# Patient Record
Sex: Male | Born: 1940 | Race: White | Hispanic: No | Marital: Married | Smoking: Former smoker
Health system: Southern US, Community
[De-identification: ages and names within clinical notes are randomized; demographics above are authoritative.]

## PROBLEM LIST (undated history)

## (undated) DIAGNOSIS — R569 Unspecified convulsions: Secondary | ICD-10-CM

## (undated) DIAGNOSIS — E785 Hyperlipidemia, unspecified: Secondary | ICD-10-CM

## (undated) DIAGNOSIS — E079 Disorder of thyroid, unspecified: Secondary | ICD-10-CM

## (undated) HISTORY — DX: Hyperlipidemia, unspecified: E78.5

## (undated) HISTORY — DX: Unspecified convulsions: R56.9

## (undated) HISTORY — DX: Disorder of thyroid, unspecified: E07.9

---

## 2001-12-16 ENCOUNTER — Encounter: Admission: RE | Admit: 2001-12-16 | Discharge: 2001-12-16 | Payer: Self-pay | Admitting: Internal Medicine

## 2001-12-16 ENCOUNTER — Encounter: Payer: Self-pay | Admitting: Internal Medicine

## 2004-02-21 ENCOUNTER — Encounter: Payer: Self-pay | Admitting: Internal Medicine

## 2005-01-28 ENCOUNTER — Encounter: Admission: RE | Admit: 2005-01-28 | Discharge: 2005-01-28 | Payer: Self-pay | Admitting: Internal Medicine

## 2005-02-18 ENCOUNTER — Encounter: Admission: RE | Admit: 2005-02-18 | Discharge: 2005-02-18 | Payer: Self-pay | Admitting: Internal Medicine

## 2007-01-27 ENCOUNTER — Encounter: Admission: RE | Admit: 2007-01-27 | Discharge: 2007-01-27 | Payer: Self-pay | Admitting: Internal Medicine

## 2009-03-01 ENCOUNTER — Encounter (INDEPENDENT_AMBULATORY_CARE_PROVIDER_SITE_OTHER): Payer: Self-pay | Admitting: *Deleted

## 2009-07-18 ENCOUNTER — Encounter (INDEPENDENT_AMBULATORY_CARE_PROVIDER_SITE_OTHER): Payer: Self-pay | Admitting: *Deleted

## 2009-08-20 ENCOUNTER — Encounter (INDEPENDENT_AMBULATORY_CARE_PROVIDER_SITE_OTHER): Payer: Self-pay

## 2009-08-21 ENCOUNTER — Ambulatory Visit: Payer: Self-pay | Admitting: Internal Medicine

## 2009-08-29 ENCOUNTER — Telehealth (INDEPENDENT_AMBULATORY_CARE_PROVIDER_SITE_OTHER): Payer: Self-pay | Admitting: *Deleted

## 2009-09-04 ENCOUNTER — Ambulatory Visit: Payer: Self-pay | Admitting: Internal Medicine

## 2009-09-04 HISTORY — PX: COLONOSCOPY: SHX174

## 2010-07-09 NOTE — Procedures (Signed)
Summary: Prep/[facility]  [facility]/ [facility]   Imported By: Lester [place] 08/16/2009 12:10:32  _____________________________________________________________________  External Attachment:    Type:   Image     Comment:   External Document

## 2010-07-09 NOTE — Letter (Signed)
Summary: Previsit letter  [facility]  [address] [place], [place] [address]   Phone: [phone]  Fax: [phone]       07/18/2009 MRN: [id number]  [facility] [address] [place], [place]  [address]  [place]  Dear Mr. GOINES,  Welcome to the Gastroenterology Division at [facility].    You are scheduled to see a nurse for your pre-procedure visit on 08-21-09 at 9:00a.m. on the 3rd floor at [facility], [address]  We ask that you try to arrive at our office 15 minutes prior to your appointment time to allow for check-in.  Your nurse visit will consist of discussing your medical and surgical history, your immediate family medical history, and your medications.    Please bring a complete list of all your medications or, if you prefer, bring the medication bottles and we will list them.  We will need to be aware of both prescribed and over the counter drugs.  We will need to know exact dosage information as well.  If you are on blood thinners (Coumadin, Plavix, Aggrenox, Ticlid, etc.) please call our office today/prior to your appointment, as we need to consult with your physician about holding your medication.   Please be prepared to read and sign documents such as consent forms, a financial agreement, and acknowledgement forms.  If necessary, and with your consent, a friend or relative is welcome to sit-in on the nurse visit with you.  Please bring your insurance card so that we may make a copy of it.  If your insurance requires a referral to see a specialist, please bring your referral form from your primary care physician.  No co-pay is required for this nurse visit.     If you cannot keep your appointment, please call [phone] to cancel or reschedule prior to your appointment date.  This allows [place] the opportunity to schedule an appointment for another patient in need of care.    Thank you for choosing [facility] for your  medical needs.  We appreciate the opportunity to care for you.  Please visit [place] at our website  to learn more about our practice.                     Sincerely.                                                                                                                   The Gastroenterology Division

## 2010-07-09 NOTE — Letter (Signed)
Summary: [facility] Instructions  [facility]  [address] [place], [place] [address]   Phone: [phone]  Fax: [phone]       Donald Robbins    Aug 02, 1940    MRN: [id number]        Procedure Day /Date:  09/04/09   Tuesday     Arrival Time:   7:30am      Procedure Time:  8:30am     Location of Procedure:                    _ x_  [facility] (4th Floor)                        PREPARATION FOR COLONOSCOPY WITH MOVIPREP   Starting 5 days prior to your procedure _ 3/24/11_ do not eat nuts, seeds, popcorn, corn, beans, peas,  salads, or any raw vegetables.  Do not take any fiber supplements (e.g. Metamucil, Citrucel, and Benefiber).  THE DAY BEFORE YOUR PROCEDURE         DATE:   09/03/09  DAY:  Monday  1.  Drink clear liquids the entire day-NO SOLID FOOD  2.  Do not drink anything colored red or purple.  Avoid juices with pulp.  No orange juice.  3.  Drink at least 64 oz. (8 glasses) of fluid/clear liquids during the day to prevent dehydration and help the prep work efficiently.  CLEAR LIQUIDS INCLUDE: Water Jello Ice Popsicles Tea (sugar ok, no milk/cream) Powdered fruit flavored drinks Coffee (sugar ok, no milk/cream) Gatorade Juice: apple, white grape, white cranberry  Lemonade Clear bullion, consomm, broth Carbonated beverages (any kind) Strained chicken noodle soup Hard Candy                             4.  In the morning, mix first dose of MoviPrep solution:    Empty 1 Pouch A and 1 Pouch B into the disposable container    Add lukewarm drinking water to the top line of the container. Mix to dissolve    Refrigerate (mixed solution should be used within 24 hrs)  5.  Begin drinking the prep at 5:00 p.m. The MoviPrep container is divided by 4 marks.   Every 15 minutes drink the solution down to the next mark (approximately 8 oz) until the full liter is complete.   6.  Follow completed prep with 16 oz of clear liquid of your  choice (Nothing red or purple).  Continue to drink clear liquids until bedtime.  7.  Before going to bed, mix second dose of MoviPrep solution:    Empty 1 Pouch A and 1 Pouch B into the disposable container    Add lukewarm drinking water to the top line of the container. Mix to dissolve    Refrigerate  THE DAY OF YOUR PROCEDURE      DATE:  09/04/09 DAY:  Tuesday  Beginning at  3:30 a.m. (5 hours before procedure):         1. Every 15 minutes, drink the solution down to the next mark (approx 8 oz) until the full liter is complete.  2. Follow completed prep with 16 oz. of clear liquid of your choice.    3. You may drink clear liquids until 6:30am  (2 HOURS BEFORE PROCEDURE).   MEDICATION INSTRUCTIONS  Unless otherwise instructed, you should take regular prescription medications with a small  sip of water   as early as possible the morning of your procedure.         OTHER INSTRUCTIONS  You will need a responsible adult at least 70 years of age to accompany you and drive you home.   This person must remain in the waiting room during your procedure.  Wear loose fitting clothing that is easily removed.  Leave jewelry and other valuables at home.  However, you may wish to bring a book to read or  an iPod/MP3 player to listen to music as you wait for your procedure to start.  Remove all body piercing jewelry and leave at home.  Total time from sign-in until discharge is approximately 2-3 hours.  You should go home directly after your procedure and rest.  You can resume normal activities the  day after your procedure.  The day of your procedure you should not:   Drive   Make legal decisions   Operate machinery   Drink alcohol   Return to work  You will receive specific instructions about eating, activities and medications before you leave.    The above instructions have been reviewed and explained to me by   Ulis Rias RN  August 21, 2009 9:10 AM     I  fully understand and can verbalize these instructions _____________________________ Date _________

## 2010-07-09 NOTE — Procedures (Signed)
Summary: [facility]  [facility]   Imported By: Lester [place] 08/16/2009 12:11:55  _____________________________________________________________________  External Attachment:    Type:   Image     Comment:   External Document

## 2010-07-09 NOTE — Progress Notes (Signed)
Summary: prep ?  Phone Note Call from Patient Call back at Home Phone [phone]   Caller: Patient Call For: Dr. Marina Goodell Reason for Call: Talk to Nurse Summary of Call: pt having a COL on the 29th, ut has been recently placed on Prednisone and wants to know if this is ok for him to take during his prep Initial call taken by: Vallarie Mare,  August 29, 2009 12:29 PM  Follow-up for Phone Call        talked with pt-- okay to continue Prednisone. Follow-up by: Ezra Sites RN,  August 29, 2009 12:49 PM

## 2010-07-09 NOTE — Miscellaneous (Signed)
Summary: Lec previsit  Clinical Lists Changes  Medications: Added new medication of MOVIPREP 100 GM  SOLR (PEG-KCL-NACL-NASULF-NA ASC-C) As per prep instructions. - Signed Rx of MOVIPREP 100 GM  SOLR (PEG-KCL-NACL-NASULF-NA ASC-C) As per prep instructions.;  #1 x 0;  Signed;  Entered by: Ulis Rias RN;  Authorized by: Hilarie Fredrickson MD;  Method used: Electronically to [facility]  [address] [id number] [address], [place], [place]  [address], Ph: [phone] or [phone], Fax: [phone] Observations: Added new observation of NKA: T (08/21/2009 8:36)    Prescriptions: MOVIPREP 100 GM  SOLR (PEG-KCL-NACL-NASULF-NA ASC-C) As per prep instructions.  #1 x 0   Entered by:   Ulis Rias RN   Authorized by:   Hilarie Fredrickson MD   Signed by:   Ulis Rias RN on 08/21/2009   Method used:   [facility] to        [facility]  [address] [id number]       [address]       [place], [place]  [address]       Ph: [phone] or [phone]       Fax: [phone]   RxID:   [id number]

## 2010-07-09 NOTE — Procedures (Signed)
Summary: Colonoscopy  Patient: [name] Note: All result statuses are Final unless otherwise noted.  Tests: (1) Colonoscopy (COL)   COL Colonoscopy           DONE     [facility]     [address]     [place], [place]  [address]           COLONOSCOPY PROCEDURE REPORT           PATIENT:  Donald Robbins, Donald Robbins  MR#:  [id number]     BIRTHDATE:  03-30-1941, 69 yrs. old  GENDER:  male     ENDOSCOPIST:  Wilhemina Bonito. Eda Keys, MD     REF. BY:  Surveillance Program Recall,     PROCEDURE DATE:  09/04/2009     PROCEDURE:  Surveillance Colonoscopy     ASA CLASS:  Class II     INDICATIONS:  history of polyps ; 2005 w/o path     MEDICATIONS:   Fentanyl 75 mcg IV, Versed 8 mg IV           DESCRIPTION OF PROCEDURE:   After the risks benefits and     alternatives of the procedure were thoroughly explained, informed     consent was obtained.  Digital rectal exam was performed and     revealed no abnormalities.   The LB CF-[id number] endoscope     was introduced through the anus and advanced to the cecum, which     was identified by both the appendix and ileocecal valve, without     limitations.Time to cecum = 3:39 min.  The quality of the prep was     good, using MoviPrep.  The instrument was then slowly withdrawn     (time =12:42 min) as the colon was fully examined.     <<PROCEDUREIMAGES>>           FINDINGS:  Severe diverticulosis was found throughout the colon.     No polyps or cancers were seen.   Retroflexed views in the rectum     revealed internal hemorrhoids.    The scope was then withdrawn     from the patient and the procedure completed.           COMPLICATIONS:  None     ENDOSCOPIC IMPRESSION:     1) Severe diverticulosis throughout the colon     2) No polyps or cancers     3) Internal hemorrhoids     RECOMMENDATIONS:     1) Continue current colorectal screening recommendations for     "routine risk" patients with a repeat colonoscopy in 10 years.        ______________________________     Wilhemina Bonito. Eda Keys, MD           CC:  Burton Apley, MD;The Patient           n.     Rosalie DoctorWilhemina Bonito. Eda Keys at 09/04/2009 09:37 AM           Rozetta Nunnery, [id number]  Note: An exclamation mark (!) indicates a result that was not dispersed into the flowsheet. Document Creation Date: 09/04/2009 9:38 AM _______________________________________________________________________  (1) Order result status: Final Collection or observation date-time: 09/04/2009 09:27 Requested date-time:  Receipt date-time:  Reported date-time:  Referring Physician:   Ordering Physician: Fransico Setters [phone]) Specimen Source:  Source: Launa Grill Order Number: [id number] Lab site:   Appended Document: Colonoscopy    Clinical Lists Changes  Observations: Added new observation of COLONNXTDUE: 08/2019 (09/04/2009 15:06)

## 2011-12-31 ENCOUNTER — Ambulatory Visit
Admission: RE | Admit: 2011-12-31 | Discharge: 2011-12-31 | Disposition: A | Discharge status: Home / Self Care | Payer: Medicare Other | Source: Ambulatory Visit | Attending: Internal Medicine | Admitting: Internal Medicine

## 2011-12-31 ENCOUNTER — Other Ambulatory Visit: Payer: Self-pay | Admitting: Internal Medicine

## 2011-12-31 DIAGNOSIS — R05 Cough: Secondary | ICD-10-CM

## 2016-04-08 ENCOUNTER — Ambulatory Visit
Admission: RE | Admit: 2016-04-08 | Discharge: 2016-04-08 | Disposition: A | Discharge status: Home / Self Care | Payer: Self-pay | Source: Ambulatory Visit | Attending: Internal Medicine | Admitting: Internal Medicine

## 2016-04-08 ENCOUNTER — Other Ambulatory Visit: Payer: Self-pay | Admitting: Internal Medicine

## 2016-04-08 DIAGNOSIS — R0781 Pleurodynia: Secondary | ICD-10-CM

## 2017-03-31 ENCOUNTER — Institutional Professional Consult (permissible substitution): Payer: Medicare Other | Admitting: Internal Medicine

## 2017-04-13 ENCOUNTER — Ambulatory Visit: Payer: Medicare Other | Admitting: Internal Medicine

## 2017-04-13 ENCOUNTER — Encounter: Payer: Self-pay | Admitting: Internal Medicine

## 2017-04-13 VITALS — BP 130/70 | HR 74 | Ht 75.0 in | Wt 166.2 lb

## 2017-04-13 DIAGNOSIS — Z87891 Personal history of nicotine dependence: Secondary | ICD-10-CM

## 2017-04-13 DIAGNOSIS — R911 Solitary pulmonary nodule: Secondary | ICD-10-CM | POA: Diagnosis not present

## 2017-04-13 DIAGNOSIS — R0609 Other forms of dyspnea: Secondary | ICD-10-CM

## 2017-04-13 NOTE — Patient Instructions (Signed)
ICD-10-CM   1. Nodule of lower lobe of left lung R91.1   2. Smoking history Z87.891   3. Dyspnea on exertion R06.09     Do PET scan Do full PFT  Return next few to several weeks to review PET scan and PFT with me or Dr  Lamonte Sakai or an APP

## 2017-04-13 NOTE — Progress Notes (Signed)
Subjective:    Patient ID: Donald Robbins, male    DOB: [birth year], 76 y.o.   MRN: [id number]    PCP [name], MD   HPI  IOV 04/13/2017  Chief Complaint  Patient presents with  . Advice Only    Referred by Dr. Mancel Bale due to a CT scan pt had done 03/12/17. Pt does have occ. SOB on exertion. Denies any cough or CP.  Pt states that he was exposed to a high amount of spray paint Summer 2018.    , Former smoker and a strong family history of lung cancer in his father and uncle. Patient himself is a limited smoker. He is quite active playing golf and doing a lot of other physical activities. He says that in spring summer 2018 he was preventing his car and then shortly after that he developed insidious onset of shortness of breath with exertion noticeable playing golf in a hurry and relieved by rest. No associated chest pain or cough or wheezing or hemoptysis. There is a 7 pound unintentional weight loss since that time. On 03/12/2017 he did have a CT scan of the chest low-dose CT scan for lung cancer screening and that showed Numerous pulmonary nodules with a nodule of greatest concern within the left lower lobe measuring 11.5 x 10.3 mm -> CT chest 03/12/17 done at [facility]. Lung-RADSTM CATEGORY:  Category 4A, suspicious. I do not have the image for my visualization but I was able to review the outside chart and confirmed the result. He is here for evaluation of the same. He is very concerned that he might have lung cancer because of his family history. He denies any travel to the [place] or [place]. He is also concerned about her shortness of breath   has no past medical history on file.   reports that he quit smoking about 46 years ago. His smoking use included cigarettes. He has a 5.00 pack-year smoking history. he has never used smokeless tobacco.  No past surgical history on file.  Not on File  Immunization History  Administered Date(s) Administered  . Influenza, High Dose  Seasonal PF 03/03/2017    No family history on file.   Current Outpatient Medications:  .  levothyroxine (SYNTHROID, LEVOTHROID) 50 MCG tablet, , Disp: , Rfl:  .  phenytoin (DILANTIN) 100 MG ER capsule, , Disp: , Rfl:  .  simvastatin (ZOCOR) 20 MG tablet, , Disp: , Rfl:     Review of Systems  Constitutional: Positive for unexpected weight change. Negative for fever.  HENT: Positive for postnasal drip. Negative for congestion, dental problem, ear pain, nosebleeds, rhinorrhea, sinus pressure, sneezing, sore throat and trouble swallowing.   Eyes: Negative for redness and itching.  Respiratory: Positive for shortness of breath. Negative for cough, chest tightness and wheezing.   Cardiovascular: Negative for palpitations and leg swelling.  Gastrointestinal: Negative for nausea and vomiting.  Genitourinary: Negative for dysuria.  Musculoskeletal: Negative for joint swelling.  Skin: Negative for rash.  Allergic/Immunologic: Negative.  Negative for environmental allergies, food allergies and immunocompromised state.  Neurological: Negative for headaches.  Hematological: Does not bruise/bleed easily.  Psychiatric/Behavioral: Negative for dysphoric mood. The patient is not nervous/anxious.        Objective:   Physical Exam  Constitutional: He is oriented to person, place, and time. He appears well-developed and well-nourished. No distress.  HENT:  Head: Normocephalic and atraumatic.  Right Ear: External ear normal.  Left Ear: External ear normal.  Mouth/Throat: Oropharynx is  clear and moist. No oropharyngeal exudate.  Eyes: Conjunctivae and EOM are normal. Pupils are equal, round, and reactive to light. Right eye exhibits no discharge. Left eye exhibits no discharge. No scleral icterus.  Neck: Normal range of motion. Neck supple. No JVD present. No tracheal deviation present. No thyromegaly present.  Cardiovascular: Normal rate, regular rhythm and intact distal pulses. Exam reveals no  gallop and no friction rub.  No murmur heard. Pulmonary/Chest: Effort normal and breath sounds normal. No respiratory distress. He has no wheezes. He has no rales. He exhibits no tenderness.  Abdominal: Soft. Bowel sounds are normal. He exhibits no distension and no mass. There is no tenderness. There is no rebound and no guarding.  Musculoskeletal: Normal range of motion. He exhibits no edema or tenderness.  Lymphadenopathy:    He has no cervical adenopathy.  Neurological: He is alert and oriented to person, place, and time. He has normal reflexes. No cranial nerve deficit. Coordination normal.  Skin: Skin is warm and dry. No rash noted. He is not diaphoretic. No erythema. No pallor.  Psychiatric: He has a normal mood and affect. His behavior is normal. Judgment and thought content normal.  Nursing note and vitals reviewed.   Vitals:   04/13/17 0904  BP: 130/70  Pulse: 74  SpO2: 98%  Weight: 166 lb 3.2 oz (75.4 kg)  Height: 6\' 3"  (1.905 m)    Estimated body mass index is 20.77 kg/m as calculated from the following:   Height as of this encounter: 6\' 3"  (1.905 m).   Weight as of this encounter: 166 lb 3.2 oz (75.4 kg).       Assessment & Plan:     ICD-10-CM   1. Nodule of lower lobe of left lung R91.1 Pulmonary function test  2. Smoking history Z87.891   3. Dyspnea on exertion R06.09   4. Solitary pulmonary nodule R91.1 NM PET Image Initial (PI) Skull Base To Thigh    Left lower lobe and other multiple lung nodules: Given male gender, age 8 and limited smoking history and strong family history I will put his overall probability for 1.1 cm left lower lobe lung nodule to be low intermediate probability for lung cancer. No spiculation or calcification described. He is not interested in exact sciences blood test study for evaluating pretest probability of a lung nodule. We discussed PET scan is a problem with any decision maker and he is very interested in it and does not want to  wait but instead wants to have the PET scan as soon as possible.  Shortness of breath on exertion: This remains unexplained we will start with getting a pulmonary function test  We will see him back in a few weeks. He is agreeable to plan   Dr. Brand Males, M.D., [facility].C.P Pulmonary and Critical Care Medicine Staff Physician [facility] Pager: [phone], If no answer or between  15:00h - 7:00h: call [phone]  04/13/2017 9:37 AM

## 2017-04-22 ENCOUNTER — Ambulatory Visit (HOSPITAL_COMMUNITY)
Admission: RE | Admit: 2017-04-22 | Discharge: 2017-04-22 | Disposition: A | Discharge status: Home / Self Care | Payer: Medicare Other | Source: Ambulatory Visit | Attending: Internal Medicine | Admitting: Internal Medicine

## 2017-04-22 DIAGNOSIS — K5730 Diverticulosis of large intestine without perforation or abscess without bleeding: Secondary | ICD-10-CM | POA: Insufficient documentation

## 2017-04-22 DIAGNOSIS — I700 Atherosclerosis of aorta: Secondary | ICD-10-CM | POA: Diagnosis not present

## 2017-04-22 DIAGNOSIS — N200 Calculus of kidney: Secondary | ICD-10-CM | POA: Insufficient documentation

## 2017-04-22 DIAGNOSIS — I2510 Atherosclerotic heart disease of native coronary artery without angina pectoris: Secondary | ICD-10-CM | POA: Insufficient documentation

## 2017-04-22 DIAGNOSIS — R911 Solitary pulmonary nodule: Secondary | ICD-10-CM | POA: Diagnosis present

## 2017-04-22 DIAGNOSIS — R918 Other nonspecific abnormal finding of lung field: Secondary | ICD-10-CM | POA: Diagnosis not present

## 2017-04-22 LAB — GLUCOSE, CAPILLARY: Glucose-Capillary: 93 mg/dL (ref 65–99)

## 2017-04-22 MED ORDER — FLUDEOXYGLUCOSE F - 18 (FDG) INJECTION
9.0000 | Freq: Once | INTRAVENOUS | Status: AC | PRN
  Administered 2017-04-22: 9 via INTRAVENOUS

## 2017-04-23 ENCOUNTER — Telehealth: Payer: Self-pay | Admitting: Internal Medicine

## 2017-04-23 DIAGNOSIS — I2584 Coronary atherosclerosis due to calcified coronary lesion: Principal | ICD-10-CM

## 2017-04-23 DIAGNOSIS — I2510 Atherosclerotic heart disease of native coronary artery without angina pectoris: Secondary | ICD-10-CM

## 2017-04-23 NOTE — Telephone Encounter (Signed)
Called pt to relay this information to him about the PET.  Will cancel pt's OV with RB.  OV scheduled 05/11/17 with MR.  Pt does have a PFT that was scheduled on 12/14 when he was to come see RB.  MR, please advise if pt still needs to have a PFT done or if we can cancel this.  Thanks!

## 2017-04-23 NOTE — Telephone Encounter (Signed)
Donald Robbins: Let patient know that PET scan nodule is "cold" and therefore lowering possibility this is lung cancer. So, in general our recommendation would be to watch.   Plan - he can cancel OV with RB and come now first aavailt to discuss with me instead Or - do a CT scan chest wo contrast in 6 months for fu of nodule and return to see me at that time  Also, Incidental finding  - kidney stone - he should talk to [name], MD  -  does have coronary artery calcification and if no normal cardiac stress test past few years; please refer to cardiologist - [facility] or Dr Einar Gip, first available     Nm Pet Image Initial (pi) Skull Base To Thigh  Result Date: 04/22/2017 CLINICAL DATA:  Initial treatment strategy for left-sided solitary pulmonary nodule seen on outside CT scan. EXAM: NUCLEAR MEDICINE PET SKULL BASE TO THIGH TECHNIQUE: 9.0 mCi F-18 FDG was injected intravenously. Full-ring PET imaging was performed from the skull base to thigh after the radiotracer. CT data was obtained and used for attenuation correction and anatomic localization. FASTING BLOOD GLUCOSE:  Value: 93 mg/dl COMPARISON:  None. FINDINGS: NECK No hypermetabolic lymph nodes in the neck. Mild bilateral carotid atherosclerotic calcification. CHEST 1.0 by 0.9 cm left lower lobe pulmonary nodule on image 54/8, maximum SUV in this vicinity 0.9. Several small bilateral subpleural nodules in the 3-4 mm range are not hypermetabolic on today' s PET-CT but are below sensitive PET-CT size thresholds. Coronary, aortic arch, and branch vessel atherosclerotic vascular disease. ABDOMEN/PELVIS No abnormal hypermetabolic activity within the liver, pancreas, adrenal glands, or spleen. No hypermetabolic lymph nodes in the abdomen or pelvis. Aortoiliac atherosclerotic vascular disease. Right mid kidney nonobstructive renal calculus measures 1.5 cm in long axis. Descending and sigmoid colon diverticulosis. SKELETON No focal hypermetabolic activity to  suggest skeletal metastasis. IMPRESSION: 1. The 1.0 by 0.9 cm left lower lobe pulmonary nodule demonstrates only very low-level metabolic activity favoring a benign etiology. Consider follow up CT scan of the chest in 1 years time to assess for stability and also assess the several other small subpleural nodules in the 3-5 mm size range. 2. Other imaging findings of potential clinical significance: Aortic Atherosclerosis (ICD10-I70.0). Coronary atherosclerosis. Large right mid kidney nonobstructive renal calculus. Descending and sigmoid colon diverticulosis. Electronically Signed   By: Van Clines M.D.   On: 04/22/2017 16:04

## 2017-04-23 NOTE — Telephone Encounter (Signed)
Called pt back to let him know about kidney stone that was noted on the PET scan and also about the coronary artery calcification that was noted as well.  Placed an order for pt to have a referral to Dr. Einar Gip with cardiology. Nothing further needed.

## 2017-04-23 NOTE — Telephone Encounter (Signed)
Patient states he just spoke with Raquel Sarna but has a few questions and would like a nurse to call back.  CB is [phone]

## 2017-04-24 NOTE — Telephone Encounter (Signed)
Called and spoke to patient he is aware of both appointment dates and times.

## 2017-04-24 NOTE — Telephone Encounter (Signed)
Called pt stating that I was aware of him speaking to Corrine about keeping the PFT appt but wanted to reschedule the PFT so we would have the results to go over with him on the day of his appt with MR.  PFT rescheduled for 05/08/17 at 12:00 and pt will see MR 05/11/17 at 9:45.  Nothing further needed.

## 2017-04-24 NOTE — Telephone Encounter (Signed)
Ok for him to have PFT and see me in December   Dr. Brand Males, M.D., [facility].C.P Pulmonary and Critical Care Medicine Staff Physician [place] [facility] Pager: [phone], If no answer or between  15:00h - 7:00h: call [phone]  04/24/2017 8:07 AM

## 2017-05-08 ENCOUNTER — Ambulatory Visit (INDEPENDENT_AMBULATORY_CARE_PROVIDER_SITE_OTHER): Payer: Medicare Other | Admitting: Internal Medicine

## 2017-05-08 DIAGNOSIS — R911 Solitary pulmonary nodule: Secondary | ICD-10-CM | POA: Diagnosis not present

## 2017-05-08 NOTE — Progress Notes (Signed)
PFT done today. 

## 2017-05-11 ENCOUNTER — Ambulatory Visit: Payer: Medicare Other | Admitting: Internal Medicine

## 2017-05-11 ENCOUNTER — Encounter: Payer: Self-pay | Admitting: Internal Medicine

## 2017-05-11 VITALS — BP 128/64 | HR 71 | Ht 75.0 in | Wt 170.2 lb

## 2017-05-11 DIAGNOSIS — R0609 Other forms of dyspnea: Secondary | ICD-10-CM

## 2017-05-11 DIAGNOSIS — R911 Solitary pulmonary nodule: Secondary | ICD-10-CM

## 2017-05-11 DIAGNOSIS — I2584 Coronary atherosclerosis due to calcified coronary lesion: Secondary | ICD-10-CM

## 2017-05-11 DIAGNOSIS — I2510 Atherosclerotic heart disease of native coronary artery without angina pectoris: Secondary | ICD-10-CM

## 2017-05-11 MED ORDER — ACLIDINIUM BROMIDE 400 MCG/ACT IN AEPB: 1.0000 | INHALATION_SPRAY | Freq: Two times a day (BID) | RESPIRATORY_TRACT | 0 refills | Status: DC

## 2017-05-11 NOTE — Patient Instructions (Signed)
ICD-10-CM   1. Nodule of lower lobe of left lung R91.1   2. Coronary artery calcification I25.10    I25.84   3. Dyspnea on exertion R06.09    Nodule of lower lobe of left lung - pet scan low uptake nov 2018; lowers possibility of lung cancer  - do followup CT scan chest wo contrast in 6 months; super D Protocol  Coronary artery calcification - keep appt with Dr Einar Gip  Dyspnea on exertion - see if cardiac workup turns up any explanation for this  - meanwhile try empiric incruse or tudorza or spiriva daily and give [place] feedback in few weeks  Followup 6 months but after ct chest wo contrast

## 2017-05-11 NOTE — Progress Notes (Signed)
Subjective:     Patient ID: Donald Robbins, male   DOB: [birth year], 76 y.o.   MRN: [id number]  HPI    PCP [name], MD   HPI  IOV 04/13/2017  Chief Complaint  Patient presents with  . Advice Only    Referred by Dr. Mancel Bale due to a CT scan pt had done 03/12/17. Pt does have occ. SOB on exertion. Denies any cough or CP.  Pt states that he was exposed to a high amount of spray paint Summer 2018.    , Former smoker and a strong family history of lung cancer in his father and uncle. Patient himself is a limited smoker. He is quite active playing golf and doing a lot of other physical activities. He says that in spring summer 2018 he was preventing his car and then shortly after that he developed insidious onset of shortness of breath with exertion noticeable playing golf in a hurry and relieved by rest. No associated chest pain or cough or wheezing or hemoptysis. There is a 7 pound unintentional weight loss since that time. On 03/12/2017 he did have a CT scan of the chest low-dose CT scan for lung cancer screening and that showed Numerous pulmonary nodules with a nodule of greatest concern within the left lower lobe measuring 11.5 x 10.3 mm -> CT chest 03/12/17 done at [facility]. Lung-RADSTM CATEGORY:  Category 4A, suspicious. I do not have the image for my visualization but I was able to review the outside chart and confirmed the result. He is here for evaluation of the same. He is very concerned that he might have lung cancer because of his family history. He denies any travel to the [place] or [place]. He is also concerned about her shortness of breath   has no past medical history on file.   reports that he quit smoking about 46 years ago. His smoking use included cigarettes. He has a 5.00 pack-year smoking history. he has never used smokeless tobacco.    OV 05/11/2017  Chief Complaint  Patient presents with  . Follow-up    PFT done 05/08/17 and PET scan done 04/22/17. Pt  states that he has been doing good since last visit. Only complaint is sinus drainage but has no complaints of cough, SOB, or CP.     FU nodule in ex-smoker: PET scan shows low uptake . They are recommending 1 year followup but I think he needs it sooner  Dyspnea: PFT normal but for mild reduction in dlco just below normal  Incidental findings: coronary artery calcification and renal stone and diverticulosis. Has cards appt pending. Other findings shared      IMPRESSION: 1. The 1.0 by 0.9 cm left lower lobe pulmonary nodule demonstrates only very low-level metabolic activity favoring a benign etiology. Consider follow up CT scan of the chest in 1 years time to assess for stability and also assess the several other small subpleural nodules in the 3-5 mm size range. 2. Other imaging findings of potential clinical significance: Aortic Atherosclerosis (ICD10-I70.0). Coronary atherosclerosis.  3.  Large right mid kidney nonobstructive renal calculus. 4.  Descending and sigmoid colon diverticulosis.   Electronically Signed   By: Van Clines M.D.   On: 04/22/2017 16:04    has no past medical history on file.   reports that he quit smoking about 46 years ago. His smoking use included cigarettes. He has a 5.00 pack-year smoking history. he has never used smokeless tobacco.  No past surgical history  on file.  Not on File  Immunization History  Administered Date(s) Administered  . Influenza, High Dose Seasonal PF 03/03/2017    No family history on file.   Current Outpatient Medications:  .  levothyroxine (SYNTHROID, LEVOTHROID) 50 MCG tablet, , Disp: , Rfl:  .  phenytoin (DILANTIN) 100 MG ER capsule, , Disp: , Rfl:  .  simvastatin (ZOCOR) 20 MG tablet, , Disp: , Rfl:   Review of Systems     Objective:   Physical Exam Vitals:   05/11/17 0936  BP: 128/64  Pulse: 71  SpO2: 99%  Weight: 170 lb 3.2 oz (77.2 kg)  Height: 6\' 3"  (1.905 m)    Estimated body  mass index is 21.27 kg/m as calculated from the following:   Height as of this encounter: 6\' 3"  (1.905 m).   Weight as of this encounter: 170 lb 3.2 oz (77.2 kg). Discussion visit    Assessment:       ICD-10-CM   1. Nodule of lower lobe of left lung R91.1   2. Coronary artery calcification I25.10    I25.84   3. Dyspnea on exertion R06.09        Plan:      Nodule of lower lobe of left lung - pet scan low uptake nov 2018; lowers possibility of lung cancer  - do followup CT scan chest wo contrast in 6 months; super D Protocol  Coronary artery calcification - keep appt with Dr Einar Gip  Dyspnea on exertion - see if cardiac workup turns up any explanation for this  - meanwhile try empiric incruse or tudorza or spiriva daily and give [place] feedback in few weeks  Followup 6 months but after ct chest wo contrast    (> 50% of this 15 min visit spent in face to face counseling or/and coordination of care)   Dr. Brand Males, M.D., [facility].C.P Pulmonary and Critical Care Medicine Staff Physician, [place] Director - Interstitial Lung Disease  Program  Pulmonary [place] at [place], [place], [address]  Pager: [phone], If no answer or between  15:00h - 7:00h: call [phone] Telephone: [phone]

## 2017-05-22 ENCOUNTER — Ambulatory Visit: Payer: Medicare Other | Admitting: Emergency Medicine

## 2017-05-27 ENCOUNTER — Emergency Department (HOSPITAL_COMMUNITY)
Admission: EM | Admit: 2017-05-27 | Discharge: 2017-05-27 | Disposition: A | Discharge status: Home / Self Care | Payer: Medicare Other | Attending: Emergency Medicine | Admitting: Emergency Medicine

## 2017-05-27 ENCOUNTER — Encounter (HOSPITAL_COMMUNITY): Payer: Self-pay

## 2017-05-27 ENCOUNTER — Emergency Department (HOSPITAL_COMMUNITY): Payer: Medicare Other

## 2017-05-27 DIAGNOSIS — Y9389 Activity, other specified: Secondary | ICD-10-CM | POA: Diagnosis not present

## 2017-05-27 DIAGNOSIS — Y998 Other external cause status: Secondary | ICD-10-CM | POA: Diagnosis not present

## 2017-05-27 DIAGNOSIS — W19XXXA Unspecified fall, initial encounter: Secondary | ICD-10-CM

## 2017-05-27 DIAGNOSIS — R569 Unspecified convulsions: Secondary | ICD-10-CM | POA: Insufficient documentation

## 2017-05-27 DIAGNOSIS — Z87891 Personal history of nicotine dependence: Secondary | ICD-10-CM | POA: Insufficient documentation

## 2017-05-27 DIAGNOSIS — W0110XA Fall on same level from slipping, tripping and stumbling with subsequent striking against unspecified object, initial encounter: Secondary | ICD-10-CM | POA: Insufficient documentation

## 2017-05-27 DIAGNOSIS — S0990XA Unspecified injury of head, initial encounter: Secondary | ICD-10-CM | POA: Diagnosis present

## 2017-05-27 DIAGNOSIS — Z79899 Other long term (current) drug therapy: Secondary | ICD-10-CM | POA: Diagnosis not present

## 2017-05-27 DIAGNOSIS — Y92017 Garden or yard in single-family (private) house as the place of occurrence of the external cause: Secondary | ICD-10-CM | POA: Diagnosis not present

## 2017-05-27 LAB — PHENYTOIN LEVEL, TOTAL: PHENYTOIN LVL: 3.1 ug/mL — AB (ref 10.0–20.0)

## 2017-05-27 MED ORDER — PHENYTOIN SODIUM 50 MG/ML IJ SOLN
1000.0000 mg | Freq: Once | INTRAMUSCULAR | Status: AC
  Administered 2017-05-27: 1000 mg via INTRAVENOUS
  Filled 2017-05-27: qty 20

## 2017-05-27 MED ORDER — PHENYTOIN SODIUM EXTENDED 100 MG PO CAPS: 100.0000 mg | ORAL_CAPSULE | Freq: Three times a day (TID) | ORAL | 0 refills | Status: DC

## 2017-05-27 NOTE — ED Notes (Signed)
Pt and family given ice water.

## 2017-05-27 NOTE — ED Notes (Signed)
ED Provider at bedside. 

## 2017-05-27 NOTE — ED Provider Notes (Signed)
I saw and evaluated the patient, reviewed the resident's note and I agree with the findings and plan.   EKG Interpretation None       Patient presents for evaluation of trip and fall, with head injury.  Patient was with a worker at home, when he fell.  His wife got to him about 2 minutes later and he was shaky as if he is having a seizure.  Shaking lasted a few seconds and was followed by a postictal state of 5-10 minutes.  No recent illnesses.  Dilantin dosing was lowered several months ago for unknown reason.  Alert elderly man who is comfortable.  Contusion and abrasion left scalp.  No dysarthria, aphasia or nystagmus.   Daleen Bo, MD 05/28/17 [phone]

## 2017-05-27 NOTE — ED Notes (Signed)
Family at bedside. Wife reports she did not in fact witness fall.

## 2017-05-27 NOTE — ED Notes (Signed)
Awaiting phenytoin from main pharmacy

## 2017-05-27 NOTE — Discharge Instructions (Signed)
Mr. Zagami, you were seen today after a fall which was likely due to his seizure as reported by your full body shaking and history of seizures.  We checked your Dilantin level in your blood which was low.  We gave you some IV Dilantin and are recommending that you increase your dose at home and have been provided a prescription for this.  You should have your Dilantin level checked in 1 week.  Please follow-up with your primary doctor at that time.  If you develop any more concerning signs or symptoms please come back to the emergency department.

## 2017-05-27 NOTE — ED Notes (Signed)
Per EDP, pt able to eat and drink

## 2017-05-27 NOTE — ED Notes (Signed)
Consulted with Jonni Sanger, pharmacist ensure filter on IV set was applied correctly.

## 2017-05-27 NOTE — ED Notes (Signed)
Patient transported to CT 

## 2017-05-27 NOTE — ED Provider Notes (Signed)
[place] EMERGENCY DEPARTMENT Provider Note   CSN: [id number] Arrival date & time: 05/27/17  1108     History   Chief Complaint Chief Complaint  Patient presents with  . Fall    HPI Donald Robbins is a 76 y.o. male.  Donald Robbins is a 76 year old male with history of seizure disorder presenting after a witnessed fall this morning with associated full body shaking and woke up with a superficial abrasion on the left side of his head.  Patient reportedly was outside talking to a contractor is doing some work on his house and the next thing he remembers is waking up in the ambulance.  Contractor reportedly saw the patient fall, however details have not been communicated with the patient or his wife.  He does not remember anything preceding the fall.  However does note that he went to sleep feeling normal and woke up this morning in his usual state.       History reviewed. No pertinent past medical history.  There are no active problems to display for this patient.   History reviewed. No pertinent surgical history.     Home Medications    Prior to Admission medications   Medication Sig Start Date End Date Taking? Authorizing Provider  Aclidinium Bromide (TUDORZA PRESSAIR) 400 MCG/ACT AEPB Inhale 1 puff into the lungs 2 (two) times daily. 05/11/17   Brand Males, MD  levothyroxine (SYNTHROID, LEVOTHROID) 50 MCG tablet  01/26/17   [provider]  phenytoin (DILANTIN) 100 MG ER capsule Take 1 capsule (100 mg total) by mouth 3 (three) times daily. 05/27/17 06/26/17  Eloise Levels, MD  simvastatin (ZOCOR) 20 MG tablet  01/26/17   [provider]    Family History No family history on file.  Social History Social History   Tobacco Use  . Smoking status: Former Smoker    Packs/day: 0.50    Years: 10.00    Pack years: 5.00    Types: Cigarettes    Last attempt to quit: 1972    Years since quitting: 46.9  . Smokeless  tobacco: Never Used  Substance Use Topics  . Alcohol use: Yes    Alcohol/week: 0.6 oz    Types: 1 Glasses of wine per week  . Drug use: No     Allergies   Patient has no known allergies.   Review of Systems Review of Systems  Constitutional: Negative.   HENT: Negative.   Eyes: Negative.   Respiratory: Negative.   Cardiovascular: Negative.   Gastrointestinal: Negative.   Musculoskeletal: Negative.   Neurological: Positive for seizures and syncope.     Physical Exam Updated Vital Signs BP 111/67   Pulse 70   Resp 14   Ht 6\' 3"  (1.905 m)   Wt 77.1 kg (170 lb)   SpO2 96%   BMI 21.25 kg/m   Physical Exam  Constitutional: He appears well-developed and well-nourished. No distress.  HENT:  Mild superficial abrasion on the lateral head in the parietal region with surrounding ecchymoses.   Skin: He is not diaphoretic.     ED Treatments / Results  Labs (all labs ordered are listed, but only abnormal results are displayed) Labs Reviewed  PHENYTOIN LEVEL, TOTAL - Abnormal; Notable for the following components:      Result Value   Phenytoin Lvl 3.1 (*)    All other components within normal limits    EKG  EKG Interpretation None       Radiology Ct  Head Wo Contrast  Result Date: 05/27/2017 CLINICAL DATA:  Status post fall. Head injury with loss of consciousness. EXAM: CT HEAD WITHOUT CONTRAST CT CERVICAL SPINE WITHOUT CONTRAST TECHNIQUE: Multidetector CT imaging of the head and cervical spine was performed following the standard protocol without intravenous contrast. Multiplanar CT image reconstructions of the cervical spine were also generated. COMPARISON:  None. FINDINGS: CT HEAD FINDINGS Brain: No evidence of acute infarction, hemorrhage, extra-axial collection, ventriculomegaly, or mass effect. Generalized cerebral atrophy. Periventricular white matter low attenuation likely secondary to microangiopathy. Vascular: Cerebrovascular atherosclerotic calcifications  are noted. Skull: Negative for fracture or focal lesion. Sinuses/Orbits: Visualized portions of the orbits are unremarkable. Visualized portions of the paranasal sinuses and mastoid air cells are unremarkable. Other: None. CT CERVICAL SPINE FINDINGS Alignment: Normal. Skull base and vertebrae: No acute fracture. No primary bone lesion or focal pathologic process. Soft tissues and spinal canal: No prevertebral fluid or swelling. No visible canal hematoma. Disc levels: Degenerative disc disease disc height loss at C4-5, C5-6 and C6-7 and to lesser extent C7-T1. Mild degenerative disc disease at C3-4. Mild broad-based disc osteophyte complex at C3-4 with bilateral uncovertebral degenerative changes. Moderate bilateral facet arthropathy at C4-5 and left uncovertebral degenerative changes resulting in moderate left foraminal stenosis. At C5-6 there is a broad-based disc osteophyte complex. At C6-7 there is bilateral facet arthropathy and bilateral uncovertebral degenerative changes with moderate left foraminal stenosis. Upper chest:  Lung apices are clear. Other: No fluid collection or hematoma. IMPRESSION: 1. No acute intracranial pathology. 2.  No acute osseous injury of the cervical spine. 3. Cervical spine spondylosis as described above. Electronically Signed   By: Kathreen Devoid   On: 05/27/2017 14:42   Ct Cervical Spine Wo Contrast  Result Date: 05/27/2017 CLINICAL DATA:  Status post fall. Head injury with loss of consciousness. EXAM: CT HEAD WITHOUT CONTRAST CT CERVICAL SPINE WITHOUT CONTRAST TECHNIQUE: Multidetector CT imaging of the head and cervical spine was performed following the standard protocol without intravenous contrast. Multiplanar CT image reconstructions of the cervical spine were also generated. COMPARISON:  None. FINDINGS: CT HEAD FINDINGS Brain: No evidence of acute infarction, hemorrhage, extra-axial collection, ventriculomegaly, or mass effect. Generalized cerebral atrophy. Periventricular  white matter low attenuation likely secondary to microangiopathy. Vascular: Cerebrovascular atherosclerotic calcifications are noted. Skull: Negative for fracture or focal lesion. Sinuses/Orbits: Visualized portions of the orbits are unremarkable. Visualized portions of the paranasal sinuses and mastoid air cells are unremarkable. Other: None. CT CERVICAL SPINE FINDINGS Alignment: Normal. Skull base and vertebrae: No acute fracture. No primary bone lesion or focal pathologic process. Soft tissues and spinal canal: No prevertebral fluid or swelling. No visible canal hematoma. Disc levels: Degenerative disc disease disc height loss at C4-5, C5-6 and C6-7 and to lesser extent C7-T1. Mild degenerative disc disease at C3-4. Mild broad-based disc osteophyte complex at C3-4 with bilateral uncovertebral degenerative changes. Moderate bilateral facet arthropathy at C4-5 and left uncovertebral degenerative changes resulting in moderate left foraminal stenosis. At C5-6 there is a broad-based disc osteophyte complex. At C6-7 there is bilateral facet arthropathy and bilateral uncovertebral degenerative changes with moderate left foraminal stenosis. Upper chest:  Lung apices are clear. Other: No fluid collection or hematoma. IMPRESSION: 1. No acute intracranial pathology. 2.  No acute osseous injury of the cervical spine. 3. Cervical spine spondylosis as described above. Electronically Signed   By: Kathreen Devoid   On: 05/27/2017 14:42    Procedures Procedures (including critical care time)  Medications Ordered in ED Medications  phenytoin (DILANTIN) 1,000 mg in sodium chloride 0.9 % 250 mL IVPB (0 mg Intravenous Stopped 05/27/17 1619)     Initial Impression / Assessment and Plan / ED Course  I have reviewed the triage vital signs and the nursing notes.  Pertinent labs & imaging results that were available during my care of the patient were reviewed by me and considered in my medical decision making (see chart for  details).     Patient is a 76 year old man with history of seizure disorder taking Dilantin 100 mg 2 times daily and found to have subtherapeutic Dilantin level and had a fall resulting in minor abrasion on the parietal region left side of his head.  He had normal CT head and neck and normal neurological exam.  Due to history of seizure, subtherapeutic Dilantin level and report of full body shaking and no evidence of prodrome or history of cardiac disease will treat as seizure.  Patient was given 1 g Dilantin load and discharged with recommendations to take 100 mg 3 times daily instant release.  Patient did take 100 mg this morning and was instructed to take an additional 200 mg this evening.  Recommended that he follow-up with his primary doctor next week and they can discuss whether he would be appropriate for him to follow-up with a neurologist in the future.  Discussed return precautions.   Final Clinical Impressions(s) / ED Diagnoses   Final diagnoses:  Seizure ([place])  Fall, initial encounter    ED Discharge Orders        Ordered    phenytoin (DILANTIN) 100 MG ER capsule  3 times daily     05/27/17 1620       Eloise Levels, MD 05/27/17 1623    Daleen Bo, MD 05/28/17 [phone]

## 2017-05-27 NOTE — ED Triage Notes (Addendum)
Pt from home via EMS for fall that resulting in a head injury and LOC. Per EMS, pt was walking outside when he tripped and fell on the concrete, hitting the L side of his head. Pt wife reports potential seizure like activity. Pt confused on EMS arrival but is now A&Ox4 however not able to recall incident. Hematoma noted to L side of head, no active bleeding. C-collar in place. EMS VS: 139/81, 90 HR, 97% on RA, CBG 130. 20 G LAC. No blood thinners.

## 2017-05-27 NOTE — ED Notes (Signed)
Pt wound cleaned with sterile saline and sterile gauze. Covered with bacitracin, 4x4 sterile gauze, and soft tape.

## 2017-06-23 ENCOUNTER — Other Ambulatory Visit: Payer: Self-pay | Admitting: Family Medicine

## 2017-11-04 ENCOUNTER — Ambulatory Visit (INDEPENDENT_AMBULATORY_CARE_PROVIDER_SITE_OTHER)
Admission: RE | Admit: 2017-11-04 | Discharge: 2017-11-04 | Disposition: A | Discharge status: Home / Self Care | Payer: Medicare Other | Source: Ambulatory Visit | Attending: Internal Medicine | Admitting: Internal Medicine

## 2017-11-04 DIAGNOSIS — R911 Solitary pulmonary nodule: Secondary | ICD-10-CM | POA: Diagnosis not present

## 2017-11-09 ENCOUNTER — Other Ambulatory Visit: Payer: Medicare Other

## 2017-11-10 ENCOUNTER — Encounter: Payer: Self-pay | Admitting: Internal Medicine

## 2017-11-10 ENCOUNTER — Ambulatory Visit: Payer: Medicare Other | Admitting: Internal Medicine

## 2017-11-10 VITALS — BP 116/70 | HR 60 | Ht 74.0 in | Wt 163.8 lb

## 2017-11-10 DIAGNOSIS — R911 Solitary pulmonary nodule: Secondary | ICD-10-CM | POA: Diagnosis not present

## 2017-11-10 DIAGNOSIS — R0609 Other forms of dyspnea: Secondary | ICD-10-CM | POA: Diagnosis not present

## 2017-11-10 NOTE — Progress Notes (Signed)
Subjective:     Patient ID: Donald Robbins, male   DOB: [birth year], 77 y.o.   MRN: [id number]  HPI    PCP [name], MD   HPI  IOV 04/13/2017  Chief Complaint  Patient presents with  . Advice Only    Referred by Dr. Mancel Bale due to a CT scan pt had done 03/12/17. Pt does have occ. SOB on exertion. Denies any cough or CP.  Pt states that he was exposed to a high amount of spray paint Summer 2018.    , Former smoker and a strong family history of lung cancer in his father and uncle. Patient himself is a limited smoker. He is quite active playing golf and doing a lot of other physical activities. He says that in spring summer 2018 he was preventing his car and then shortly after that he developed insidious onset of shortness of breath with exertion noticeable playing golf in a hurry and relieved by rest. No associated chest pain or cough or wheezing or hemoptysis. There is a 7 pound unintentional weight loss since that time. On 03/12/2017 he did have a CT scan of the chest low-dose CT scan for lung cancer screening and that showed Numerous pulmonary nodules with a nodule of greatest concern within the left lower lobe measuring 11.5 x 10.3 mm -> CT chest 03/12/17 done at [facility]. Lung-RADSTM CATEGORY:  Category 4A, suspicious. I do not have the image for my visualization but I was able to review the outside chart and confirmed the result. He is here for evaluation of the same. He is very concerned that he might have lung cancer because of his family history. He denies any travel to the [place] or [place]. He is also concerned about her shortness of breath   has no past medical history on file.   reports that he quit smoking about 46 years ago. His smoking use included cigarettes. He has a 5.00 pack-year smoking history. he has never used smokeless tobacco.    OV 05/11/2017  Chief Complaint  Patient presents with  . Follow-up    PFT done 05/08/17 and PET scan done 04/22/17. Pt  states that he has been doing good since last visit. Only complaint is sinus drainage but has no complaints of cough, SOB, or CP.     FU nodule in ex-smoker: PET scan shows low uptake . They are recommending 1 year followup but I think he needs it sooner  Dyspnea: PFT normal but for mild reduction in dlco just below normal  Incidental findings: coronary artery calcification and renal stone and diverticulosis. Has cards appt pending. Other findings shared    OV 11/10/2017  Chief Complaint  Patient presents with  . Follow-up    follow up for CT results. breathing good. patient is no longer using inhaler.    Follow-up lung nodules: He had a PET scan that showed low uptake.  But nevertheless the nodule was concerning so had a six-month CT scan done.  The CT scan was personally visualized.  It was read by Dr. Rosario Jacks and she feels its subpleural lymph nodes.  And therefore benign.  In terms of coronary artery calcification: He has seen Dr. Einar Gip and had cardiac stress test and was normal:.  In terms of dyspnea: This is improved.  We tried inhaler because of his isolated reduction diffusion capacity but the inhalers did not help.  Nevertheless overall he is improved.   CT chest 11/04/17 IMPRESSION: 1. Scattered subpleural nodules measure 10  mm or less in size, are unchanged from 04/22/2017 and are likely subpleural lymph nodes. 2. Aortic atherosclerosis (ICD10-170.0). Coronary artery calcification.   Electronically Signed   By: Lorin Picket M.D.   On: 11/04/2017 11:40    has no past medical history on file.   reports that he quit smoking about 47 years ago. His smoking use included cigarettes. He has a 5.00 pack-year smoking history. He has never used smokeless tobacco.  No past surgical history on file.  No Known Allergies  Immunization History  Administered Date(s) Administered  . Influenza, High Dose Seasonal PF 03/03/2017    No family history on file.   Current  Outpatient Medications:  .  aspirin EC 81 MG tablet, Take 81 mg by mouth daily., Disp: , Rfl:  .  levothyroxine (SYNTHROID, LEVOTHROID) 50 MCG tablet, Take 50 mcg by mouth daily before breakfast. , Disp: , Rfl:  .  Melatonin 5 MG TABS, Take 5 mg by mouth 2 (two) times daily. , Disp: , Rfl:  .  phenytoin (DILANTIN) 100 MG ER capsule, Take 100 mg by mouth 2 (two) times daily., Disp: , Rfl:  .  simvastatin (ZOCOR) 20 MG tablet, Take 20 mg by mouth daily. , Disp: , Rfl:  .  Aclidinium Bromide (TUDORZA PRESSAIR) 400 MCG/ACT AEPB, Inhale 1 puff into the lungs 2 (two) times daily. (Patient not taking: Reported on 11/10/2017), Disp: 1 each, Rfl: 0   Review of Systems     Objective:   Physical Exam  Constitutional: He is oriented to person, place, and time. He appears well-developed and well-nourished. No distress.  HENT:  Head: Normocephalic and atraumatic.  Right Ear: External ear normal.  Left Ear: External ear normal.  Mouth/Throat: Oropharynx is clear and moist. No oropharyngeal exudate.  Eyes: Pupils are equal, round, and reactive to light. Conjunctivae and EOM are normal. Right eye exhibits no discharge. Left eye exhibits no discharge. No scleral icterus.  Neck: Normal range of motion. Neck supple. No JVD present. No tracheal deviation present. No thyromegaly present.  Cardiovascular: Normal rate, regular rhythm and intact distal pulses. Exam reveals no gallop and no friction rub.  No murmur heard. Pulmonary/Chest: Effort normal and breath sounds normal. No respiratory distress. He has no wheezes. He has no rales. He exhibits no tenderness.  Abdominal: Soft. Bowel sounds are normal. He exhibits no distension and no mass. There is no tenderness. There is no rebound and no guarding.  Musculoskeletal: Normal range of motion. He exhibits no edema or tenderness.  Lymphadenopathy:    He has no cervical adenopathy.  Neurological: He is alert and oriented to person, place, and time. He has normal  reflexes. No cranial nerve deficit. Coordination normal.  Skin: Skin is warm and dry. No rash noted. He is not diaphoretic. No erythema. No pallor.  Psychiatric: He has a normal mood and affect. His behavior is normal. Judgment and thought content normal.  Nursing note and vitals reviewed.  Today's Vitals   11/10/17 1114  BP: 116/70  Pulse: 60  SpO2: 98%  Weight: 163 lb 12.8 oz (74.3 kg)  Height: 6\' 2"  (1.88 m)       Assessment:       ICD-10-CM   1. Nodule of lower lobe of left lung R91.1   2. Dyspnea on exertion R06.09        Plan:     Nodule of lower lobe of left lung  - stable; most c/w benign lymph node - repeat CT chest  without contrast June/july 2019; if stable then no furhter followup CT  Dyspnea on exertion - glad better and glad stress test for heart is fine  - expectant followup  Followup 1 year after CT chest without contrast   Dr. Brand Males, M.D., [facility].C.P Pulmonary and Critical Care Medicine Staff Physician, [place] Director - Interstitial Lung Disease  Program  Pulmonary [place] at [place], [place], [address]  Pager: [phone], If no answer or between  15:00h - 7:00h: call [phone] Telephone: [phone]

## 2017-11-10 NOTE — Patient Instructions (Addendum)
Nodule of lower lobe of left lung  - stable; most c/w benign lymph node - repeat CT chest without contrast June/july 2019; if stable then no furhter followup CT  Dyspnea on exertion - glad better and glad stress test for heart is fine  - expectant followup  Followup 1 year after CT chest without contrast

## 2017-12-08 LAB — PULMONARY FUNCTION TEST
DL/VA % pred: 79 %
DL/VA: 3.8 ml/min/mmHg/L
DLCO COR % PRED: 72 %
DLCO UNC: 27.01 ml/min/mmHg
DLCO cor: 26.79 ml/min/mmHg
DLCO unc % pred: 72 %
FEF 25-75 POST: 3.38 L/s
FEF 25-75 Pre: 3.3 L/sec
FEF2575-%Change-Post: 2 %
FEF2575-%PRED-POST: 136 %
FEF2575-%Pred-Pre: 133 %
FEV1-%CHANGE-POST: 1 %
FEV1-%Pred-Post: 116 %
FEV1-%Pred-Pre: 114 %
FEV1-PRE: 3.96 L
FEV1-Post: 4.02 L
FEV1FVC-%Change-Post: -1 %
FEV1FVC-%Pred-Pre: 105 %
FEV6-%Change-Post: 3 %
FEV6-%PRED-POST: 119 %
FEV6-%Pred-Pre: 115 %
FEV6-POST: 5.36 L
FEV6-PRE: 5.17 L
FEV6FVC-%CHANGE-POST: 0 %
FEV6FVC-%PRED-POST: 106 %
FEV6FVC-%PRED-PRE: 106 %
FVC-%Change-Post: 2 %
FVC-%PRED-POST: 112 %
FVC-%PRED-PRE: 109 %
FVC-POST: 5.38 L
FVC-PRE: 5.23 L
Post FEV1/FVC ratio: 75 %
Post FEV6/FVC ratio: 100 %
Pre FEV1/FVC ratio: 76 %
Pre FEV6/FVC Ratio: 100 %
RV % PRED: 84 %
RV: 2.36 L
TLC % PRED: 95 %
TLC: 7.41 L

## 2018-12-16 ENCOUNTER — Inpatient Hospital Stay: Admission: RE | Admit: 2018-12-16 | Payer: Medicare Other | Source: Ambulatory Visit

## 2018-12-21 ENCOUNTER — Telehealth: Payer: Self-pay | Admitting: *Deleted

## 2018-12-21 NOTE — Telephone Encounter (Signed)

## 2018-12-22 ENCOUNTER — Ambulatory Visit (INDEPENDENT_AMBULATORY_CARE_PROVIDER_SITE_OTHER)
Admission: RE | Admit: 2018-12-22 | Discharge: 2018-12-22 | Disposition: A | Discharge status: Home / Self Care | Payer: Medicare Other | Source: Ambulatory Visit | Attending: Internal Medicine | Admitting: Internal Medicine

## 2018-12-22 ENCOUNTER — Other Ambulatory Visit: Payer: Self-pay

## 2018-12-22 DIAGNOSIS — R911 Solitary pulmonary nodule: Secondary | ICD-10-CM

## 2019-05-12 ENCOUNTER — Other Ambulatory Visit: Payer: Self-pay

## 2019-05-12 DIAGNOSIS — Z20822 Contact with and (suspected) exposure to covid-19: Secondary | ICD-10-CM

## 2019-05-14 LAB — NOVEL CORONAVIRUS, NAA: SARS-CoV-2, NAA: DETECTED — AB

## 2019-05-15 ENCOUNTER — Telehealth: Payer: Self-pay | Admitting: Unknown Physician Specialty

## 2019-05-15 ENCOUNTER — Other Ambulatory Visit: Payer: Self-pay | Admitting: Unknown Physician Specialty

## 2019-05-15 DIAGNOSIS — U071 COVID-19: Secondary | ICD-10-CM

## 2019-05-15 NOTE — Telephone Encounter (Signed)
Discussed with patient about Covid symptoms and the use of bamlanivimab, a monoclonal antibody infusion for those with mild to moderate Covid symptoms and at a high risk of hospitalization.  Pt is qualified for this infusion at the [facility] infusion center due to Age > 70.    After discussing the infusion's costs, potential benefits and side effects, the patient has decided to accept treatment with monoclonal antibodies.

## 2019-05-16 ENCOUNTER — Ambulatory Visit (HOSPITAL_COMMUNITY): Payer: Medicare Other

## 2019-05-16 NOTE — Telephone Encounter (Signed)
Pt returning call/needs appointment information. Please call pt: [phone]

## 2019-05-16 NOTE — Telephone Encounter (Signed)
Scheduled for 0830 12/8

## 2019-05-17 ENCOUNTER — Ambulatory Visit (HOSPITAL_COMMUNITY)
Admission: RE | Admit: 2019-05-17 | Discharge: 2019-05-17 | Disposition: A | Discharge status: Home / Self Care | Payer: Medicare Other | Source: Ambulatory Visit | Attending: Pulmonary Disease | Admitting: Pulmonary Disease

## 2019-05-17 DIAGNOSIS — Z23 Encounter for immunization: Secondary | ICD-10-CM | POA: Diagnosis not present

## 2019-05-17 DIAGNOSIS — U071 COVID-19: Secondary | ICD-10-CM | POA: Insufficient documentation

## 2019-05-17 MED ORDER — ALBUTEROL SULFATE HFA 108 (90 BASE) MCG/ACT IN AERS: 2.0000 | INHALATION_SPRAY | Freq: Once | RESPIRATORY_TRACT | Status: DC | PRN

## 2019-05-17 MED ORDER — FAMOTIDINE IN NACL 20-0.9 MG/50ML-% IV SOLN: 20.0000 mg | Freq: Once | INTRAVENOUS | Status: DC | PRN

## 2019-05-17 MED ORDER — SODIUM CHLORIDE 0.9 % IV SOLN: INTRAVENOUS | Status: DC | PRN

## 2019-05-17 MED ORDER — METHYLPREDNISOLONE SODIUM SUCC 125 MG IJ SOLR: 125.0000 mg | Freq: Once | INTRAMUSCULAR | Status: DC | PRN

## 2019-05-17 MED ORDER — DIPHENHYDRAMINE HCL 50 MG/ML IJ SOLN: 50.0000 mg | Freq: Once | INTRAMUSCULAR | Status: DC | PRN

## 2019-05-17 MED ORDER — EPINEPHRINE 0.3 MG/0.3ML IJ SOAJ: 0.3000 mg | Freq: Once | INTRAMUSCULAR | Status: DC | PRN

## 2019-05-17 MED ORDER — SODIUM CHLORIDE 0.9 % IV SOLN
700.0000 mg | Freq: Once | INTRAVENOUS | Status: AC
  Administered 2019-05-17: 700 mg via INTRAVENOUS
  Filled 2019-05-17: qty 20

## 2019-05-17 NOTE — Progress Notes (Signed)
.   Diagnosis: COVID-19  Physician: Dr. Joya Gaskins   Procedure: [facility] Infusion [facility] Med: bamlanivimab infusion - Provided patient with bamlanimivab fact sheet for patients, parents and caregivers prior to infusion.  Complications: No immediate complications noted.  Discharge: Discharged home   [name] 05/17/2019

## 2019-07-28 ENCOUNTER — Telehealth: Payer: Self-pay | Admitting: Internal Medicine

## 2019-07-28 NOTE — Telephone Encounter (Signed)
Ct summer 2020 - stable lung noduled -> pls give result but apologize for delay ->then dec 2020 he got covid   Plan  - do CT chst without contrast in summer 2021 -> return to see APP (OR) he can come and see me/app in April 2021     IMPRESSION: 1. Small bilateral subpleural nodules are not changed from April 22, 2017. For the most part these are thought to be benign. The 9 by 9 mm left lower lobe subpleural nodule was not previously hypermetabolic on November 14, 99991111, and is likewise highly likely to be benign. Given the 20 months of demonstrated stability, consider one final follow up noncontrast chest CT in 6 months time to fully establish 2 full years of stability. 2.  Aortic Atherosclerosis (ICD10-I70.0).  Coronary atherosclerosis.   Electronically Signed   By: Van Clines M.D.   On: 12/22/2018 11:31

## 2019-08-09 NOTE — Telephone Encounter (Signed)
Called and spoke with pt to see if I could get him scheduled for an appt and he stated that was fine. Pt has been scheduled an appt with MR Mon. 4/26. Nothing further needed.

## 2019-10-03 ENCOUNTER — Encounter: Payer: Self-pay | Admitting: Internal Medicine

## 2019-10-03 ENCOUNTER — Ambulatory Visit (INDEPENDENT_AMBULATORY_CARE_PROVIDER_SITE_OTHER): Payer: Medicare Other | Admitting: Internal Medicine

## 2019-10-03 ENCOUNTER — Other Ambulatory Visit: Payer: Self-pay

## 2019-10-03 VITALS — BP 112/68 | HR 60 | Ht 74.0 in | Wt 168.0 lb

## 2019-10-03 DIAGNOSIS — G8929 Other chronic pain: Secondary | ICD-10-CM

## 2019-10-03 DIAGNOSIS — M25552 Pain in left hip: Secondary | ICD-10-CM | POA: Diagnosis not present

## 2019-10-03 DIAGNOSIS — R918 Other nonspecific abnormal finding of lung field: Secondary | ICD-10-CM

## 2019-10-03 DIAGNOSIS — R911 Solitary pulmonary nodule: Secondary | ICD-10-CM

## 2019-10-03 DIAGNOSIS — R0600 Dyspnea, unspecified: Secondary | ICD-10-CM

## 2019-10-03 DIAGNOSIS — R0609 Other forms of dyspnea: Secondary | ICD-10-CM

## 2019-10-03 NOTE — Patient Instructions (Addendum)
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

## 2019-10-03 NOTE — Progress Notes (Signed)
PCP [name], MD   HPI  IOV 04/13/2017  Chief Complaint  Patient presents with  . Advice Only    Referred by Dr. Mancel Bale due to a CT scan pt had done 03/12/17. Pt does have occ. SOB on exertion. Denies any cough or CP.  Pt states that he was exposed to a high amount of spray paint Summer 2018.    , Former smoker and a strong family history of lung cancer in his father and uncle. Patient himself is a limited smoker. He is quite active playing golf and doing a lot of other physical activities. He says that in spring summer 2018 he was preventing his car and then shortly after that he developed insidious onset of shortness of breath with exertion noticeable playing golf in a hurry and relieved by rest. No associated chest pain or cough or wheezing or hemoptysis. There is a 7 pound unintentional weight loss since that time. On 03/12/2017 he did have a CT scan of the chest low-dose CT scan for lung cancer screening and that showed Numerous pulmonary nodules with a nodule of greatest concern within the left lower lobe measuring 11.5 x 10.3 mm -> CT chest 03/12/17 done at [facility]. Lung-RADSTM CATEGORY:  Category 4A, suspicious. I do not have the image for my visualization but I was able to review the outside chart and confirmed the result. He is here for evaluation of the same. He is very concerned that he might have lung cancer because of his family history. He denies any travel to the [place] or [place]. He is also concerned about her shortness of breath   has no past medical history on file.   reports that he quit smoking about 46 years ago. His smoking use included cigarettes. He has a 5.00 pack-year smoking history. he has never used smokeless tobacco.    OV 05/11/2017  Chief Complaint  Patient presents with  . Follow-up    PFT done 05/08/17 and PET scan done 04/22/17. Pt states that he has been doing good since last visit. Only complaint is sinus drainage but has no complaints  of cough, SOB, or CP.     FU nodule in ex-smoker: PET scan shows low uptake . They are recommending 1 year followup but I think he needs it sooner  Dyspnea: PFT normal but for mild reduction in dlco just below normal  Incidental findings: coronary artery calcification and renal stone and diverticulosis. Has cards appt pending. Other findings shared    OV 11/10/2017  Chief Complaint  Patient presents with  . Follow-up    follow up for CT results. breathing good. patient is no longer using inhaler.    Follow-up lung nodules: He had a PET scan that showed low uptake.  But nevertheless the nodule was concerning so had a six-month CT scan done.  The CT scan was personally visualized.  It was read by Dr. Rosario Jacks and she feels its subpleural lymph nodes.  And therefore benign.  In terms of coronary artery calcification: He has seen Dr. Einar Gip and had cardiac stress test and was normal:.  In terms of dyspnea: This is improved.  We tried inhaler because of his isolated reduction diffusion capacity but the inhalers did not help.  Nevertheless overall he is improved.   CT chest 11/04/17 IMPRESSION: 1. Scattered subpleural nodules measure 10 mm or less in size, are unchanged from 04/22/2017 and are likely subpleural lymph nodes. 2. Aortic atherosclerosis (ICD10-170.0). Coronary artery calcification.  Electronically Signed   By: Lorin Picket M.D.   On: 11/04/2017 11:40    has no past medical history on file.   reports that he quit smoking about 47 years ago. His smoking use included cigarettes. He has a 5.00 pack-year smoking history. He has never used smokeless tobacco.   OV 10/03/2019  Subjective:  Patient ID: Donald Robbins, male , DOB: [birth year] , age 79 y.o. , MRN: [id number] , ADDRESS: [place] [address]   10/03/2019 -   Chief Complaint  Patient presents with  . Follow-up    Pt last seen 11/10/17.  Pt states he has been doing fine since last visit.  Pt states he has been having some pain in the back right above the hip but states breathing has been good.     HPI Donald Robbins 79 y.o. -returns for follow-up.  He does not have dyspnea.  He is feeling fine.  In December 2020 he had COVID-19 and got monoclonal antibody.  He did well did not need hospitalization he does not have residual effects.  He has pulmonary nodule.  He had a CT scan in July 2020 these are essentially unchanged from November 2018.  A repeat CT scan of the chest is recommended.  He is not having any hemoptysis or weight loss.  The only new issue is having is left hip/left flank suprailiac area pain that is constant and intermittent without clear-cut aggravating or relieving factors in September 2020.  He says initially primary care has reassured him but it is persisting and is not followed up.  There is no weight loss.  Is not limiting his activities but it is persistent.  It is mild to moderate in intensity there is no radiation.    IMPRESSION: CT cest July 2020 1. Small bilateral subpleural nodules are not changed from April 22, 2017. For the most part these are thought to be benign. The 9 by 9 mm left lower lobe subpleural nodule was not previously hypermetabolic on November 14, 99991111, and is likewise highly likely to be benign. Given the 20 months of demonstrated stability, consider one final follow up noncontrast chest CT in 6 months time to fully establish 2 full years of stability. 2.  Aortic Atherosclerosis (ICD10-I70.0).  Coronary atherosclerosis.   Electronically Signed   By: Van Clines M.D.   On: 12/22/2018 11:31 ROS - per HPI     has no past medical history on file.   reports that he quit smoking about 49 years ago. His smoking use included cigarettes. He has a 5.00 pack-year smoking history. He has never used smokeless tobacco.  No past surgical history on file.  No Known Allergies  Immunization History  Administered Date(s)  Administered  . Influenza, High Dose Seasonal PF 03/03/2017, 02/08/2019  . PFIZER SARS-COV-2 Vaccination 08/17/2019, 08/29/2019    No family history on file.   Current Outpatient Medications:  .  aspirin EC 81 MG tablet, Take 81 mg by mouth daily., Disp: , Rfl:  .  levothyroxine (SYNTHROID, LEVOTHROID) 50 MCG tablet, Take 50 mcg by mouth daily before breakfast. , Disp: , Rfl:  .  Melatonin 5 MG TABS, Take 5 mg by mouth 2 (two) times daily. , Disp: , Rfl:  .  phenytoin (DILANTIN) 100 MG ER capsule, Take 100 mg by mouth 2 (two) times daily., Disp: , Rfl:  .  simvastatin (ZOCOR) 20 MG tablet, Take 20 mg by mouth daily. , Disp: , Rfl:  Objective:   Vitals:   10/03/19 1109  BP: 112/68  Pulse: 60  SpO2: 100%  Weight: 168 lb (76.2 kg)  Height: 6\' 2"  (1.88 m)    Estimated body mass index is 21.57 kg/m as calculated from the following:   Height as of this encounter: 6\' 2"  (1.88 m).   Weight as of this encounter: 168 lb (76.2 kg).  @WEIGHTCHANGE @  [employer]   10/03/19 1109  Weight: 168 lb (76.2 kg)     Physical Exam Tall thin male.  Normal lung sounds normal heart sounds alert and oriented x3.  No tenderness elicited in the left flank area.  Spine appears normal.  Gait is normal.  Stands well abdomen soft.  No neck nodes no elevated JVP.  Throat is normal.      Assessment:       ICD-10-CM   1. Nodule of lower lobe of left lung  R91.1   2. Dyspnea on exertion  R06.00   3. 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 Brand Males, M.D., F.C.C.P,  Pulmonary and Critical Care Medicine Staff Physician, [facility] Director - Interstitial Lung Disease  Program  [facility] [place] [facility] [place], [place], [address]  Pager: [phone], If no answer or between  15:00h - 7:00h: call [phone] Telephone: [phone]  11:33 AM 10/03/2019

## 2019-11-04 ENCOUNTER — Ambulatory Visit (AMBULATORY_SURGERY_CENTER): Payer: Self-pay | Admitting: *Deleted

## 2019-11-04 ENCOUNTER — Other Ambulatory Visit: Payer: Self-pay

## 2019-11-04 VITALS — Ht 74.0 in | Wt 166.0 lb

## 2019-11-04 DIAGNOSIS — Z1211 Encounter for screening for malignant neoplasm of colon: Secondary | ICD-10-CM

## 2019-11-04 MED ORDER — NA SULFATE-K SULFATE-MG SULF 17.5-3.13-1.6 GM/177ML PO SOLN: ORAL | 0 refills | Status: DC

## 2019-11-04 NOTE — Progress Notes (Signed)
Patient is here in-person for PV. Patient denies any allergies to eggs or soy. Patient denies any problems with anesthesia/sedation. Patient denies any oxygen use at home. Patient denies taking any diet/weight loss medications or blood thinners. Patient is not being treated for MRSA or C-diff. Patient is aware of our care-partner policy and 0000000 safety protocol. EMMI education assisgned to the patient for the procedure, this was explained and instructions given to patient.   Completed covid vaccines on 08/29/19.

## 2019-11-09 ENCOUNTER — Encounter: Payer: Self-pay | Admitting: Internal Medicine

## 2019-11-15 ENCOUNTER — Ambulatory Visit (AMBULATORY_SURGERY_CENTER): Payer: Medicare Other | Admitting: Internal Medicine

## 2019-11-15 ENCOUNTER — Other Ambulatory Visit: Payer: Self-pay

## 2019-11-15 ENCOUNTER — Encounter: Payer: Self-pay | Admitting: Internal Medicine

## 2019-11-15 VITALS — BP 110/65 | HR 59 | Resp 10 | Ht 74.0 in | Wt 166.0 lb

## 2019-11-15 DIAGNOSIS — K5730 Diverticulosis of large intestine without perforation or abscess without bleeding: Secondary | ICD-10-CM

## 2019-11-15 DIAGNOSIS — K635 Polyp of colon: Secondary | ICD-10-CM

## 2019-11-15 DIAGNOSIS — D125 Benign neoplasm of sigmoid colon: Secondary | ICD-10-CM

## 2019-11-15 DIAGNOSIS — Z1211 Encounter for screening for malignant neoplasm of colon: Secondary | ICD-10-CM | POA: Diagnosis not present

## 2019-11-15 MED ORDER — SODIUM CHLORIDE 0.9 % IV SOLN: 500.0000 mL | Freq: Once | INTRAVENOUS | Status: DC

## 2019-11-15 NOTE — Op Note (Signed)
[facility] Patient Name: Donald Robbins Procedure Date: 11/15/2019 10:37 AM MRN: [id number] Endoscopist: Docia Chuck. [name] , MD Age: 79 Referring MD:  Date of Birth: 08/04/1940 Gender: Male Account #: [id number] Procedure:                Colonoscopy with cold snare polypectomy x 1 Indications:              Screening for colorectal malignant neoplasm.                            Previous examinations 2005 and 2011 were negative                            for neoplasia Medicines:                Monitored Anesthesia Care Procedure:                Pre-Anesthesia Assessment:                           - Prior to the procedure, a History and Physical                            was performed, and patient medications and                            allergies were reviewed. The patient's tolerance of                            previous anesthesia was also reviewed. The risks                            and benefits of the procedure and the sedation                            options and risks were discussed with the patient.                            All questions were answered, and informed consent                            was obtained. Prior Anticoagulants: The patient has                            taken no previous anticoagulant or antiplatelet                            agents. ASA Grade Assessment: II - A patient with                            mild systemic disease. After reviewing the risks                            and benefits, the patient was deemed in  satisfactory condition to undergo the procedure.                           After obtaining informed consent, the colonoscope                            was passed under direct vision. Throughout the                            procedure, the patient's blood pressure, pulse, and                            oxygen saturations were monitored continuously. The                            Colonoscope was  introduced through the anus and                            advanced to the the cecum, identified by                            appendiceal orifice and ileocecal valve. The                            ileocecal valve, appendiceal orifice, and rectum                            were photographed. The quality of the bowel                            preparation was good. The colonoscopy was performed                            without difficulty. The patient tolerated the                            procedure well. The bowel preparation used was                            SUPREP via split dose instruction. Scope In: 10:49:12 AM Scope Out: 11:04:44 AM Scope Withdrawal Time: 0 hours 12 minutes 32 seconds  Total Procedure Duration: 0 hours 15 minutes 32 seconds  Findings:                 A 2 mm polyp was found in the sigmoid colon. The                            polyp was removed with a cold snare. Resection and                            retrieval were complete.                           Many small and large-mouthed diverticula were found  in the entire colon.                           The exam was otherwise without abnormality on                            direct and retroflexion views. Complications:            No immediate complications. Estimated blood loss:                            None. Estimated Blood Loss:     Estimated blood loss: none. Impression:               - One 2 mm polyp in the sigmoid colon, removed with                            a cold snare. Resected and retrieved.                           - Diverticulosis in the entire examined colon.                           - The examination was otherwise normal on direct                            and retroflexion views. Recommendation:           - Repeat colonoscopy is not recommended for                            surveillance.                           - Patient has a contact number available for                             emergencies. The signs and symptoms of potential                            delayed complications were discussed with the                            patient. Return to normal activities tomorrow.                            Written discharge instructions were provided to the                            patient.                           - Resume previous diet.                           - Continue present medications.                           -  Await pathology results. Docia Chuck. [name], MD 11/15/2019 11:10:01 AM This report has been signed electronically.

## 2019-11-15 NOTE — Progress Notes (Signed)
Called to room to assist during endoscopic procedure.  Patient ID and intended procedure confirmed with present staff. Received instructions for my participation in the procedure from the performing physician.  

## 2019-11-15 NOTE — Patient Instructions (Signed)
YOU HAD AN ENDOSCOPIC PROCEDURE TODAY AT THE [facility]:   Refer to the procedure report that was given to you for any specific questions about what was found during the examination.  If the procedure report does not answer your questions, please call your gastroenterologist to clarify.  If you requested that your care partner not be given the details of your procedure findings, then the procedure report has been included in a sealed envelope for you to review at your convenience later.  YOU SHOULD EXPECT: Some feelings of bloating in the abdomen. Passage of more gas than usual.  Walking can help get rid of the air that was put into your GI tract during the procedure and reduce the bloating. If you had a lower endoscopy (such as a colonoscopy or flexible sigmoidoscopy) you may notice spotting of blood in your stool or on the toilet paper. If you underwent a bowel prep for your procedure, you may not have a normal bowel movement for a few days.  Please Note:  You might notice some irritation and congestion in your nose or some drainage.  This is from the oxygen used during your procedure.  There is no need for concern and it should clear up in a day or so.  SYMPTOMS TO REPORT IMMEDIATELY:   Following lower endoscopy (colonoscopy or flexible sigmoidoscopy):  Excessive amounts of blood in the stool  Significant tenderness or worsening of abdominal pains  Swelling of the abdomen that is new, acute  Fever of 100F or higher   For urgent or emergent issues, a gastroenterologist can be reached at any hour by calling [phone]. Do not use MyChart messaging for urgent concerns.    DIET:  We do recommend a small meal at first, but then you may proceed to your regular diet.  Drink plenty of fluids but you should avoid alcoholic beverages for 24 hours.  MEDICATIONS: Continue present medications.  Please see handouts given to you by your recovery nurse.  ACTIVITY:  You should plan to  take it easy for the rest of today and you should NOT DRIVE or use heavy machinery until tomorrow (because of the sedation medicines used during the test).    FOLLOW UP: Our staff will call the number listed on your records 48-72 hours following your procedure to check on you and address any questions or concerns that you may have regarding the information given to you following your procedure. If we do not reach you, we will leave a message.  We will attempt to reach you two times.  During this call, we will ask if you have developed any symptoms of COVID 19. If you develop any symptoms (ie: fever, flu-like symptoms, shortness of breath, cough etc.) before then, please call [phone].  If you test positive for Covid 19 in the 2 weeks post procedure, please call and report this information to us.    If any biopsies were taken you will be contacted by phone or by letter within the next 1-3 weeks.  Please call us at [phone] if you have not heard about the biopsies in 3 weeks.   Thank you for allowing us to provide for your healthcare needs today.   SIGNATURES/CONFIDENTIALITY: You and/or your care partner have signed paperwork which will be entered into your electronic medical record.  These signatures attest to the fact that that the information above on your After Visit Summary has been reviewed and is understood.  Full responsibility of the   confidentiality of this discharge information lies with you and/or your care-partner. 

## 2019-11-15 NOTE — Progress Notes (Signed)
A/ox3, pleased with MAC, report to RN 

## 2019-11-15 NOTE — Progress Notes (Signed)
Pt's states no medical or surgical changes since previsit or office visit. 

## 2019-11-16 ENCOUNTER — Telehealth: Payer: Self-pay | Admitting: Internal Medicine

## 2019-11-16 NOTE — Telephone Encounter (Signed)
ATC patient.  LM to call back. 

## 2019-11-16 NOTE — Telephone Encounter (Signed)
Patient is returning phone call. Patient phone number is [phone].

## 2019-11-16 NOTE — Telephone Encounter (Signed)
Spoke with pt, he made an appt with MR on 12/28/2019 at 10:45 to discuss CT results. Nothing further is needed.

## 2019-11-17 ENCOUNTER — Telehealth: Payer: Self-pay

## 2019-11-17 ENCOUNTER — Encounter: Payer: Self-pay | Admitting: Internal Medicine

## 2019-11-17 NOTE — Telephone Encounter (Signed)
LVM

## 2019-11-17 NOTE — Telephone Encounter (Signed)
Left message on answering machine. 

## 2019-12-20 ENCOUNTER — Other Ambulatory Visit: Payer: Self-pay

## 2019-12-20 ENCOUNTER — Ambulatory Visit (INDEPENDENT_AMBULATORY_CARE_PROVIDER_SITE_OTHER)
Admission: RE | Admit: 2019-12-20 | Discharge: 2019-12-20 | Disposition: A | Discharge status: Home / Self Care | Payer: Medicare Other | Source: Ambulatory Visit | Attending: Internal Medicine | Admitting: Internal Medicine

## 2019-12-20 DIAGNOSIS — R918 Other nonspecific abnormal finding of lung field: Secondary | ICD-10-CM

## 2019-12-28 ENCOUNTER — Other Ambulatory Visit: Payer: Self-pay

## 2019-12-28 ENCOUNTER — Ambulatory Visit: Payer: Medicare Other | Admitting: Internal Medicine

## 2019-12-28 ENCOUNTER — Encounter: Payer: Self-pay | Admitting: Internal Medicine

## 2019-12-28 VITALS — BP 122/68 | HR 66 | Temp 98.3°F | Ht 75.0 in | Wt 164.6 lb

## 2019-12-28 DIAGNOSIS — R911 Solitary pulmonary nodule: Secondary | ICD-10-CM

## 2019-12-28 NOTE — Progress Notes (Signed)
PCP [name], MD   HPI  IOV 04/13/2017  Chief Complaint  Patient presents with  . Advice Only    Referred by Dr. Mancel Bale due to a CT scan pt had done 03/12/17. Pt does have occ. SOB on exertion. Denies any cough or CP.  Pt states that he was exposed to a high amount of spray paint Summer 2018.    , Former smoker and a strong family history of lung cancer in his father and uncle. Patient himself is a limited smoker. He is quite active playing golf and doing a lot of other physical activities. He says that in spring summer 2018 he was preventing his car and then shortly after that he developed insidious onset of shortness of breath with exertion noticeable playing golf in a hurry and relieved by rest. No associated chest pain or cough or wheezing or hemoptysis. There is a 7 pound unintentional weight loss since that time. On 03/12/2017 he did have a CT scan of the chest low-dose CT scan for lung cancer screening and that showed Numerous pulmonary nodules with a nodule of greatest concern within the left lower lobe measuring 11.5 x 10.3 mm -> CT chest 03/12/17 done at [facility]. Lung-RADSTM CATEGORY:  Category 4A, suspicious. I do not have the image for my visualization but I was able to review the outside chart and confirmed the result. He is here for evaluation of the same. He is very concerned that he might have lung cancer because of his family history. He denies any travel to the [place] or [place]. He is also concerned about her shortness of breath   has no past medical history on file.   reports that he quit smoking about 46 years ago. His smoking use included cigarettes. He has a 5.00 pack-year smoking history. he has never used smokeless tobacco.    OV 05/11/2017  Chief Complaint  Patient presents with  . Follow-up    PFT done 05/08/17 and PET scan done 04/22/17. Pt states that he has been doing good since last visit. Only complaint is sinus drainage but has no complaints  of cough, SOB, or CP.     FU nodule in ex-smoker: PET scan shows low uptake . They are recommending 1 year followup but I think he needs it sooner  Dyspnea: PFT normal but for mild reduction in dlco just below normal  Incidental findings: coronary artery calcification and renal stone and diverticulosis. Has cards appt pending. Other findings shared    OV 11/10/2017  Chief Complaint  Patient presents with  . Follow-up    follow up for CT results. breathing good. patient is no longer using inhaler.    Follow-up lung nodules: He had a PET scan that showed low uptake.  But nevertheless the nodule was concerning so had a six-month CT scan done.  The CT scan was personally visualized.  It was read by Dr. Rosario Jacks and she feels its subpleural lymph nodes.  And therefore benign.  In terms of coronary artery calcification: He has seen Dr. Einar Gip and had cardiac stress test and was normal:.  In terms of dyspnea: This is improved.  We tried inhaler because of his isolated reduction diffusion capacity but the inhalers did not help.  Nevertheless overall he is improved.   CT chest 11/04/17 IMPRESSION: 1. Scattered subpleural nodules measure 10 mm or less in size, are unchanged from 04/22/2017 and are likely subpleural lymph nodes. 2. Aortic atherosclerosis (ICD10-170.0). Coronary artery calcification.  Electronically Signed   By: Lorin Picket M.D.   On: 11/04/2017 11:40    has no past medical history on file.   reports that he quit smoking about 47 years ago. His smoking use included cigarettes. He has a 5.00 pack-year smoking history. He has never used smokeless tobacco.   OV 10/03/2019  Subjective:  Patient ID: Donald Robbins, male , DOB: 08-12-1940 , age 54 y.o. , MRN: [id number] , ADDRESS: [place] [address]   10/03/2019 -   Chief Complaint  Patient presents with  . Follow-up    Pt last seen 11/10/17.  Pt states he has been doing fine since last visit.  Pt states he has been having some pain in the back right above the hip but states breathing has been good.     HPI Donald Robbins 79 y.o. -returns for follow-up.  He does not have dyspnea.  He is feeling fine.  In December 2020 he had COVID-19 and got monoclonal antibody.  He did well did not need hospitalization he does not have residual effects.  He has pulmonary nodule.  He had a CT scan in July 2020 these are essentially unchanged from November 2018.  A repeat CT scan of the chest is recommended.  He is not having any hemoptysis or weight loss.  The only new issue is having is left hip/left flank suprailiac area pain that is constant and intermittent without clear-cut aggravating or relieving factors in September 2020.  He says initially primary care has reassured him but it is persisting and is not followed up.  There is no weight loss.  Is not limiting his activities but it is persistent.  It is mild to moderate in intensity there is no radiation.    IMPRESSION: CT cest July 2020 1. Small bilateral subpleural nodules are not changed from April 22, 2017. For the most part these are thought to be benign. The 9 by 9 mm left lower lobe subpleural nodule was not previously hypermetabolic on April 22, 6212, and is likewise highly likely to be benign. Given the 20 months of demonstrated stability, consider one final follow up noncontrast chest CT in 6 months time to fully establish 2 full years of stability. 2.  Aortic Atherosclerosis (ICD10-I70.0).  Coronary atherosclerosis.   Electronically Signed   By: Van Clines M.D.   On: 12/22/2018 11:31 ROS - per HPI    OV 12/28/2019  Subjective:  Patient ID: Donald Robbins, male , DOB: 1940/08/17 , age 63 y.o. , MRN: [id number] , ADDRESS: [place] [address]   12/28/2019 -   Chief Complaint  Patient presents with  . Follow-up    No complaints-follow up on CT result     HPI Donald Robbins 79  y.o. -here to discuss his CT scan results.  This was supposed to be a telephone visit but for some reason ended up being a face-to-face visit.  He is not sure why.  The report shows the lung nodules are stable in the last 3 years.  He currently denies any dyspnea or cough.  He has an occasional tickle in his throat from postnasal drip but that is about it.  He feels good.  He did inquire about spinal changes on the CT.  I reviewed the CT results again and there is no reports on spinal changes.  In fact in 2018 he had a C-spine CT that shows degenerative changes.  I pointed him to  that CT.  Overall no new issues.   IMPRESSION: 1. Stable exam. Multiple tiny bilateral pulmonary nodules described previously as stable back to 2018 are unchanged in the interval since 2020. The 9 x 9 mm subpleural left lower lobe nodule of concern on the previous study is unchanged in the interval and now with documented stability back to PET-CT of 04/22/2017 providing 2 years of imaging follow-up, lesion is consistent with benign etiology. 2. Aortic Atherosclerosis (ICD10-I70.0).   Electronically Signed   By: Misty Stanley M.D.   On: 12/20/2019 13:32  ROS - per HPI     has a past medical history of Hyperlipidemia, Seizures ([place]) (2017 "blacked out"), and Thyroid disease.   reports that he quit smoking about 49 years ago. His smoking use included cigarettes. He has a 5.00 pack-year smoking history. He has never used smokeless tobacco.  Past Surgical History:  Procedure Laterality Date  . COLONOSCOPY  09/04/2009    No Known Allergies  Immunization History  Administered Date(s) Administered  . Influenza, High Dose Seasonal PF 03/03/2017, 02/08/2019  . PFIZER SARS-COV-2 Vaccination 08/17/2019, 08/29/2019    Family History  Problem Relation Age of Onset  . Prostate cancer Father   . Colon cancer Neg Hx   . Colon polyps Neg Hx   . Esophageal cancer Neg Hx   . Rectal cancer Neg Hx   . Stomach  cancer Neg Hx      Current Outpatient Medications:  .  aspirin EC 81 MG tablet, Take 81 mg by mouth daily., Disp: , Rfl:  .  Cholecalciferol (VITAMIN D3 PO), Take by mouth., Disp: , Rfl:  .  levothyroxine (SYNTHROID, LEVOTHROID) 50 MCG tablet, Take 50 mcg by mouth daily before breakfast. , Disp: , Rfl:  .  Melatonin 5 MG TABS, Take 5 mg by mouth 2 (two) times daily. , Disp: , Rfl:  .  Multiple Vitamin (MULTIVITAMIN) tablet, Take 1 tablet by mouth daily., Disp: , Rfl:  .  Omega 3-6-9 Fatty Acids (TRIPLE OMEGA-3-6-9 PO), Take by mouth., Disp: , Rfl:  .  Omega-3 Fatty Acids (FISH OIL PO), Take by mouth., Disp: , Rfl:  .  phenytoin (DILANTIN) 100 MG ER capsule, Take 100 mg by mouth 2 (two) times daily., Disp: , Rfl:  .  simvastatin (ZOCOR) 20 MG tablet, Take 20 mg by mouth daily. , Disp: , Rfl:   Current Facility-Administered Medications:  .  0.9 %  sodium chloride infusion, 500 mL, Intravenous, Once, Irene Shipper, MD      Objective:   Vitals:   12/28/19 1051  BP: 122/68  Pulse: 66  Temp: 98.3 F (36.8 C)  TempSrc: Oral  SpO2: 98%  Weight: 164 lb 9.6 oz (74.7 kg)  Height: 6\' 3"  (1.905 m)    Estimated body mass index is 20.57 kg/m as calculated from the following:   Height as of this encounter: 6\' 3"  (1.905 m).   Weight as of this encounter: 164 lb 9.6 oz (74.7 kg).  @WEIGHTCHANGE @  [employer]   12/28/19 1051  Weight: 164 lb 9.6 oz (74.7 kg)     Physical Exam Pleasant tall thin male.  Has postnasal drip.  Clear to auscultation bilaterally no neck nodes no elevated JVP normal heart sounds abdomen soft no cyanosis no clubbing no edema.  Overall nonfocal exam.        Assessment:       ICD-10-CM   1. Nodule of lower lobe of left lung  R91.1  Plan:     Patient Instructions  Nodule of lower lobe of left lung  - stable; most c/w benign lymph node 2018 -> July 2020 -> July 2021  Plan - no further followup - return sooner if needed     SIGNATURE     Dr. Brand Males, M.D., F.C.C.P,  Pulmonary and Critical Care Medicine Staff Physician, [place] Director - Interstitial Lung Disease  Program  Pulmonary Ward at [place], [place], [address]  Pager: [phone], If no answer or between  15:00h - 7:00h: call [phone] Telephone: [phone]  11:29 AM 12/28/2019

## 2019-12-28 NOTE — Patient Instructions (Addendum)
Nodule of lower lobe of left lung  - stable; most c/w benign lymph node 2018 -> July 2020 -> July 2021  Plan - no further followup - return sooner if needed

## 2020-10-17 IMAGING — CT CT CHEST WITHOUT CONTRAST
2 of 3 series · 15 of 36 positions shown, 18 images · non-contrast
Comparison: PET-CT dated April 22, 2017; chest CT from November 04, 2016

CLINICAL DATA: Left lower lobe pulmonary nodule

EXAM:
CT CHEST WITHOUT CONTRAST
TECHNIQUE: Multidetector CT imaging of the chest was performed following the
standard protocol without IV contrast.

[Series 2: thorax · axial · 0.78mm/px · z∈[-311,+1]mm · 12 of 184 slices shown, 15 images]
[im 14/184  mediastinal]
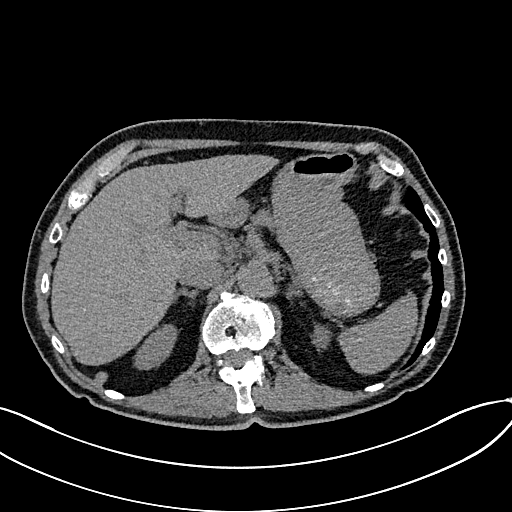
[im 14/184  lung]
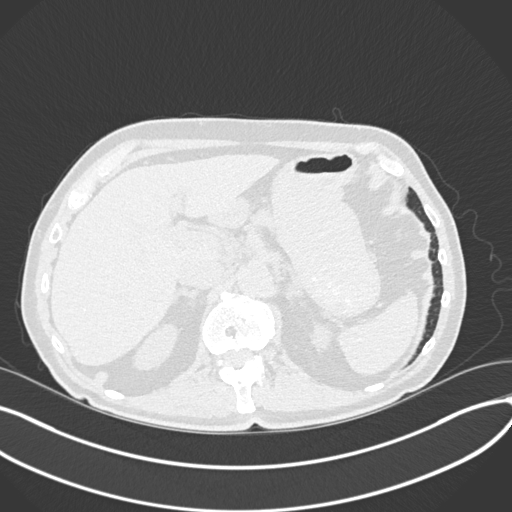
[im 28/184  lung]
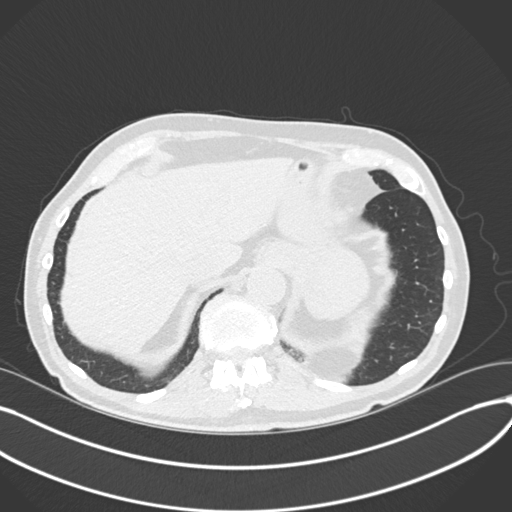
[im 41/184  lung]
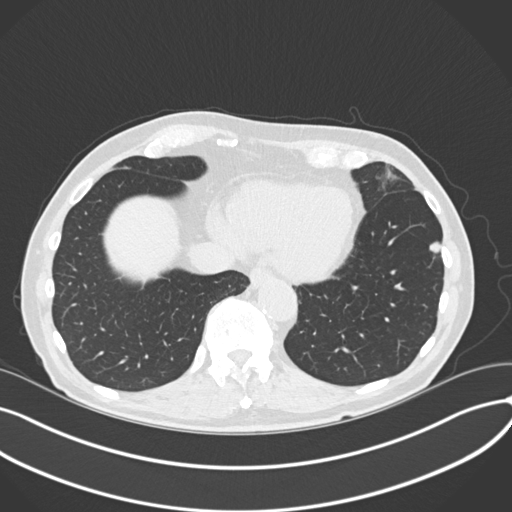
[im 55/184  lung]
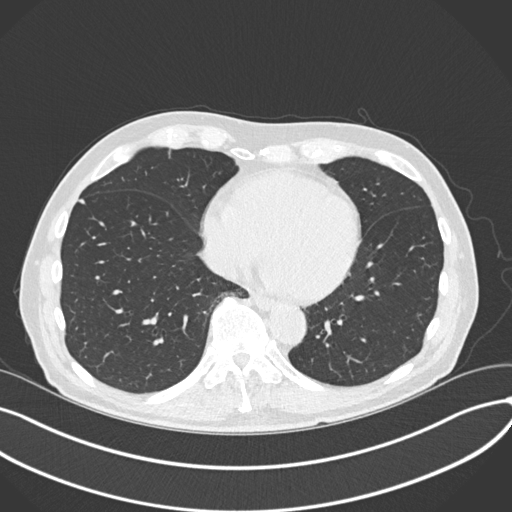
[im 68/184  mediastinal]
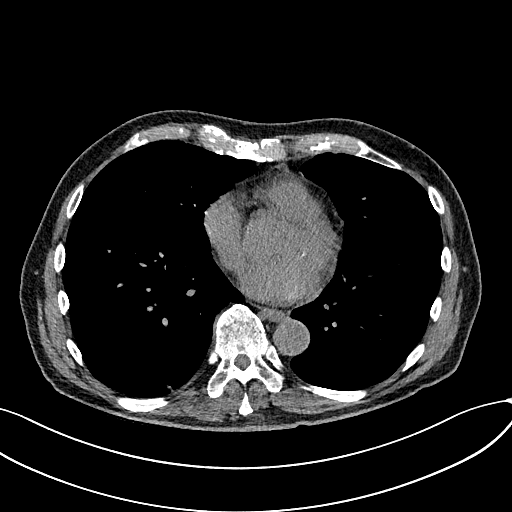
[im 68/184  lung]
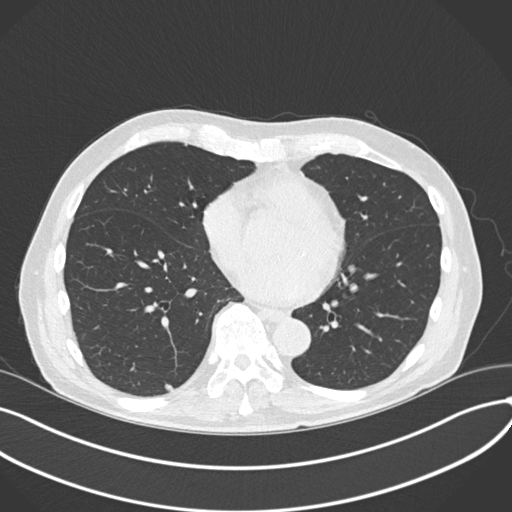
[im 82/184  lung]
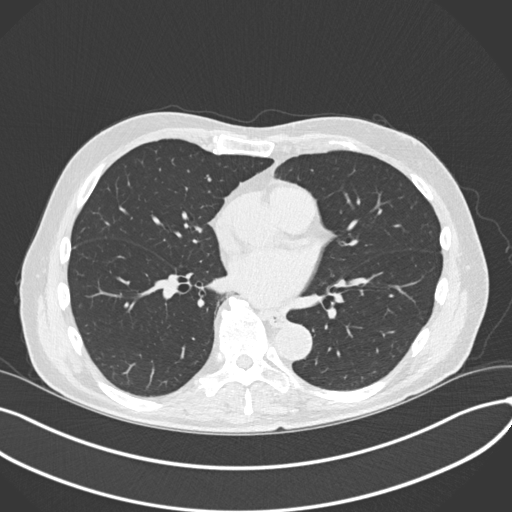
[im 102/184  lung]
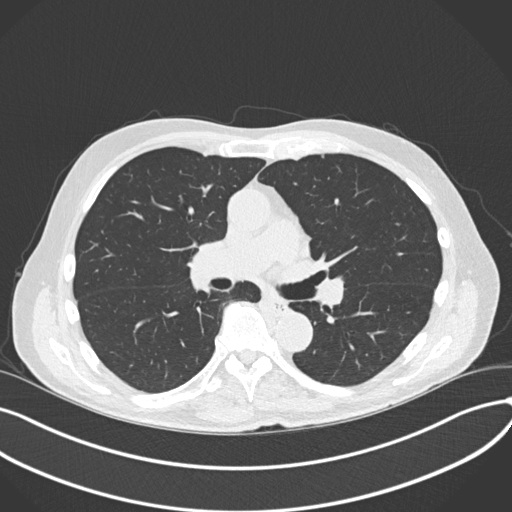
[im 116/184  lung]
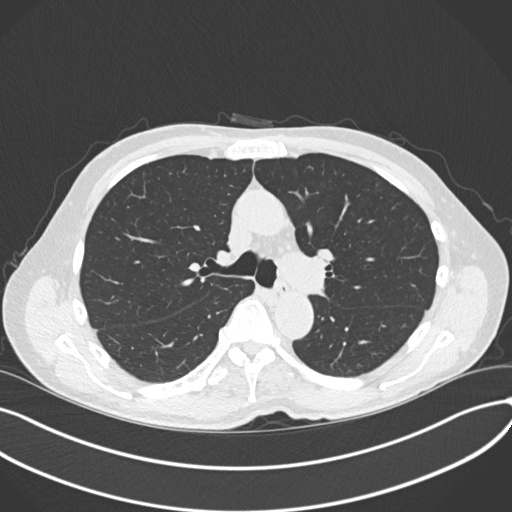
[im 129/184  mediastinal]
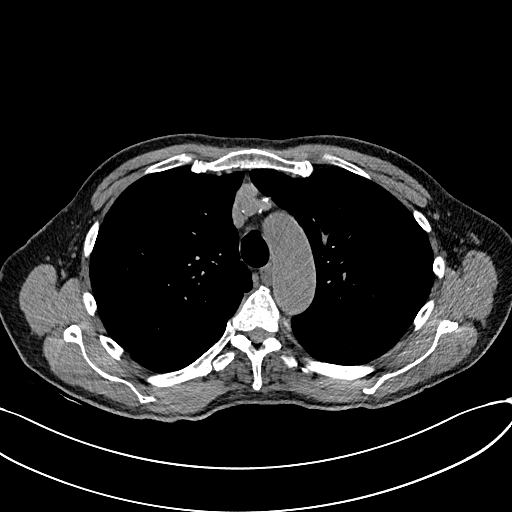
[im 129/184  lung]
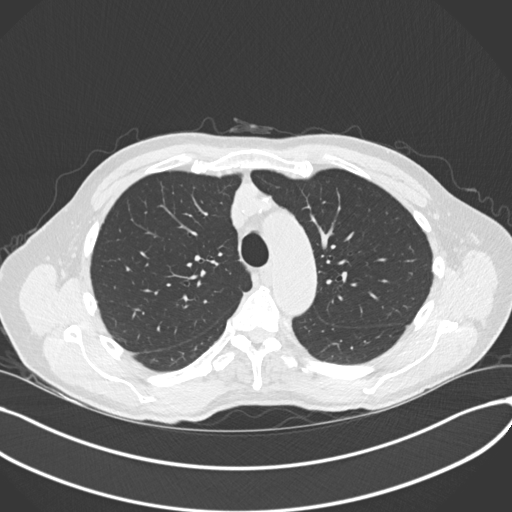
[im 143/184  lung]
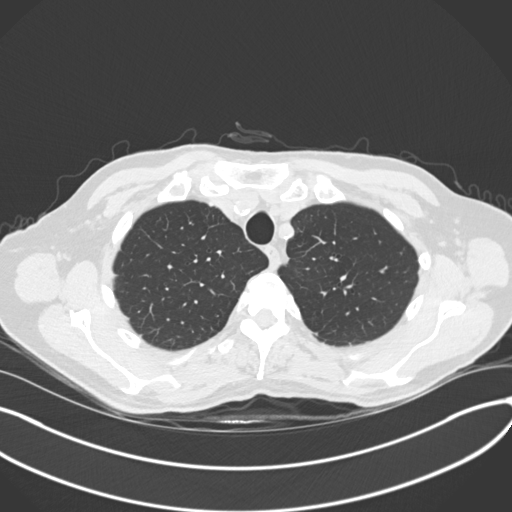
[im 156/184  lung]
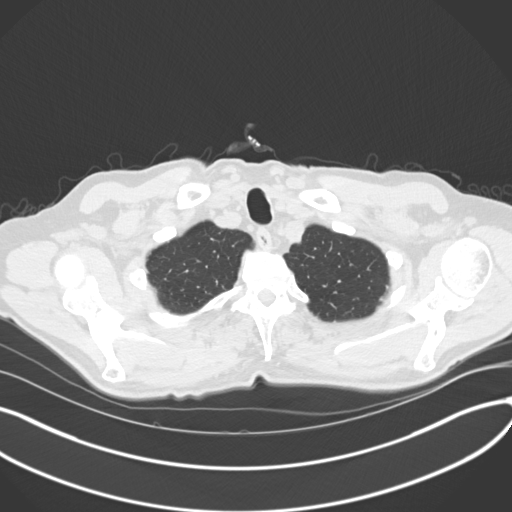
[im 170/184  lung]
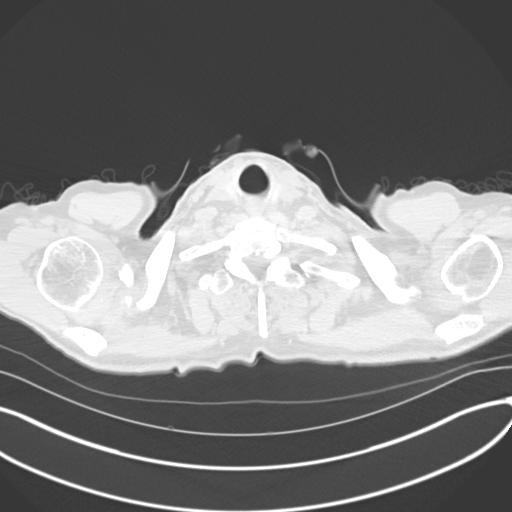

[Series 5: coronal · coronal · 0.70mm/px · 3 of 121 slices shown]
[im 25/121  lung]
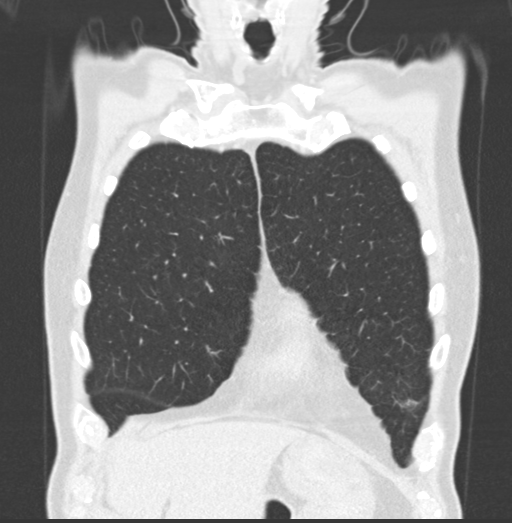
[im 49/121  lung]
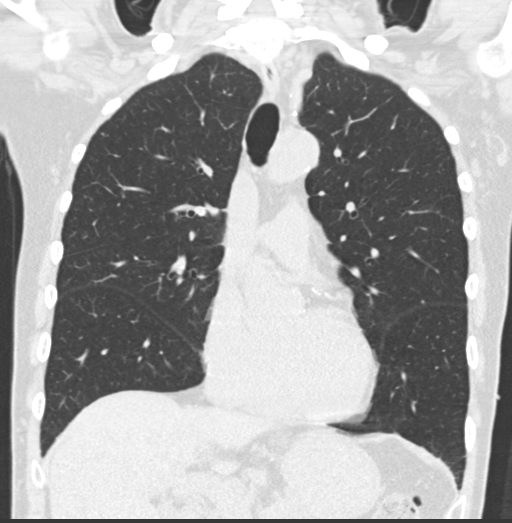
[im 73/121  lung]
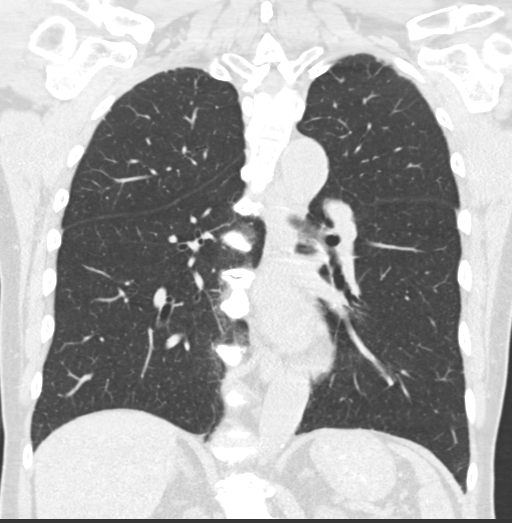

[15 of 36 positions shown; findings below may reference images not displayed]

FINDINGS: Cardiovascular: Coronary, aortic arch, and branch vessel
atherosclerotic vascular disease.

Mediastinum/Nodes: Unremarkable

Lungs/Pleura: Mild biapical pleuroparenchymal scarring.

3 mm subpleural nodule in the right middle lobe on image 120/3, no
change from 1102.

5 by 3 mm right lower lobe pulmonary nodule anteriorly on image
130/3, no change from 1102.

3 by 4 mm posterior basal segment right lower lobe nodule on image
151/3, no change from 1102 although slightly obscured by motion
artifact on that exam.

6 by 5 mm right lower lobe subpleural nodule on image 118/3 common
no change from 1102. Other similar small subpleural nodules on the
right are likewise not appreciably changed.

3 mm left upper lobe pulmonary nodule on image 75/3, no change from
1102. Small left upper lobe subpleural nodule on image 82/3
anteriorly, no change from 1102. 0.5 by 0.3 cm subpleural nodule in
the left lower lobe on image 133/3, stable.

0.9 by 0.9 cm left lower lobe subpleural nodule on image 145/3,
stable from 1102. Additional small left lower lobe subpleural
nodules are stable.

Upper Abdomen: Unremarkable

Musculoskeletal: Partial interbody fusion at L1-2.
IMPRESSION: 1. Small bilateral subpleural nodules are not changed from April 22, 2017. For the most part these are thought to be benign. The 9 by
9 mm left lower lobe subpleural nodule was not previously
hypermetabolic on April 22, 2017, and is likewise highly likely
to be benign. Given the 20 months of demonstrated stability,
consider one final follow up noncontrast chest CT in 6 months time
to fully establish 2 full years of stability.
2.  Aortic Atherosclerosis ([id number].B).  Coronary atherosclerosis.

## 2021-10-15 IMAGING — CT CT CHEST W/O CM
1 of 2 series · 14 of 32 positions shown, 18 images · non-contrast
Comparison: 12/22/2018

CLINICAL DATA: Pulmonary nodule

EXAM:
CT CHEST WITHOUT CONTRAST
TECHNIQUE: Multidetector CT imaging of the chest was performed following the
standard protocol without IV contrast.

[Series 2: thorax · axial · 0.76mm/px · z∈[-319,-33]mm · 14 of 171 slices shown, 18 images]
[im 14/171  mediastinal]
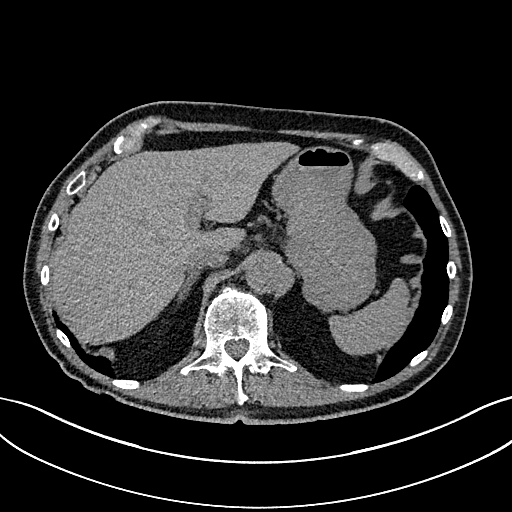
[im 14/171  lung]
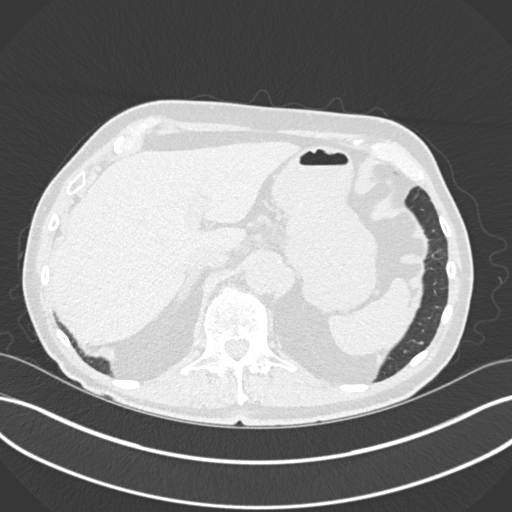
[im 27/171  lung]
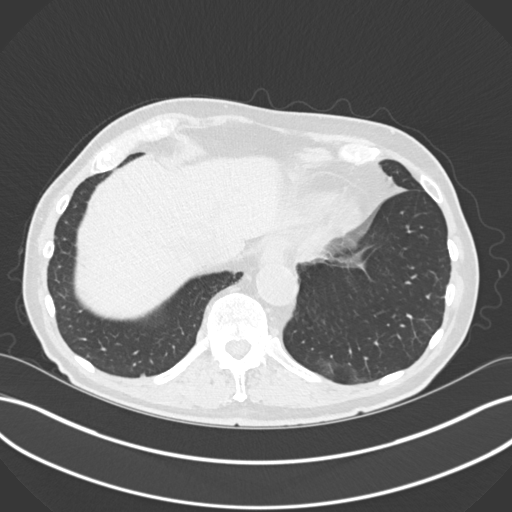
[im 40/171  lung]
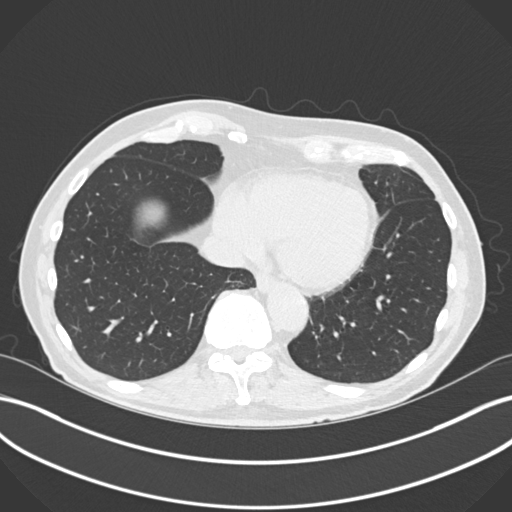
[im 53/171  lung]
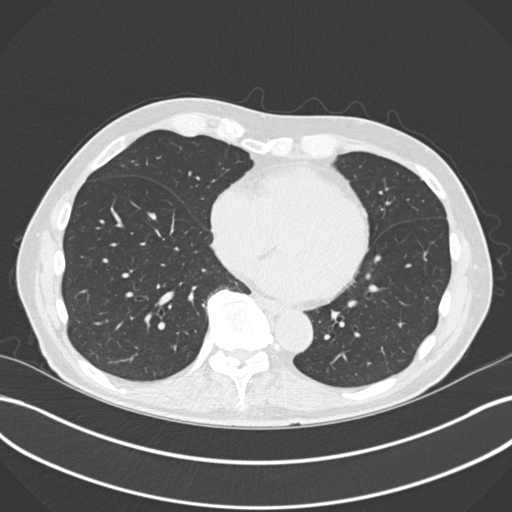
[im 66/171  mediastinal]
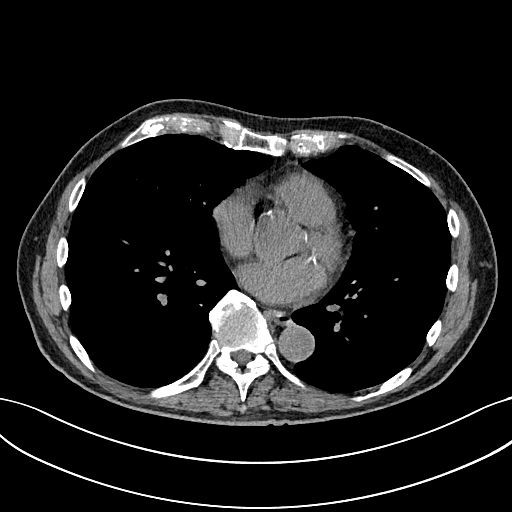
[im 66/171  lung]
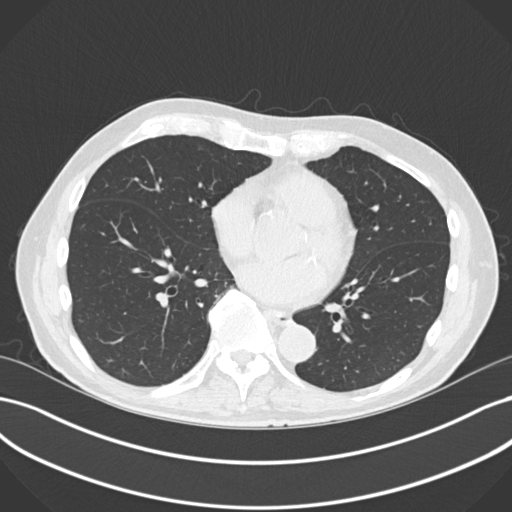
[im 79/171  lung]
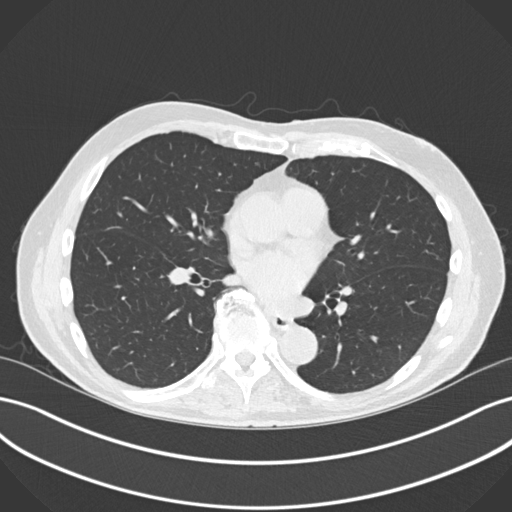
[im 82/171  lung]
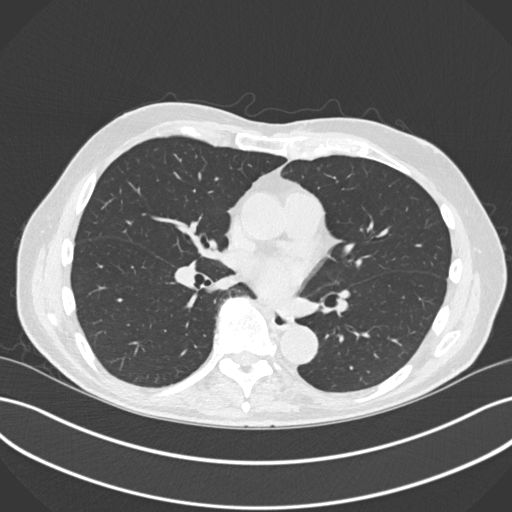
[im 86/171  lung]
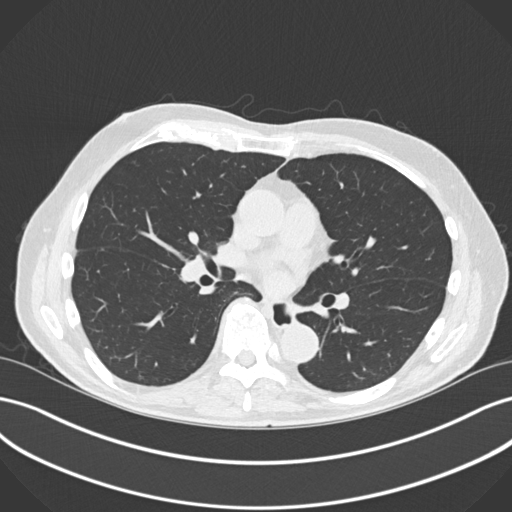
[im 92/171  mediastinal]
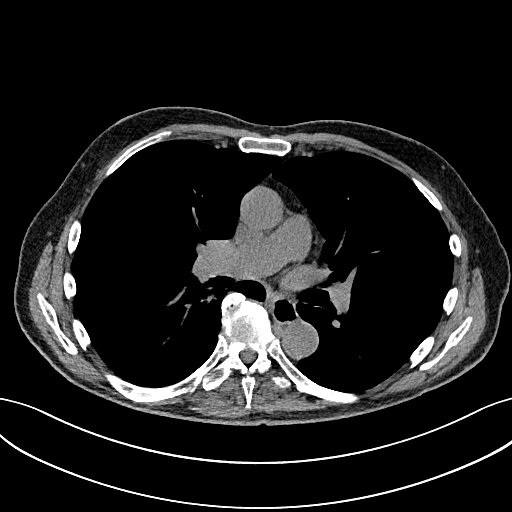
[im 92/171  lung]
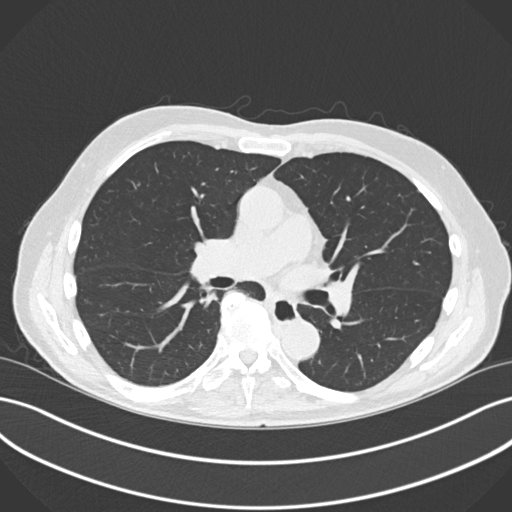
[im 105/171  lung]
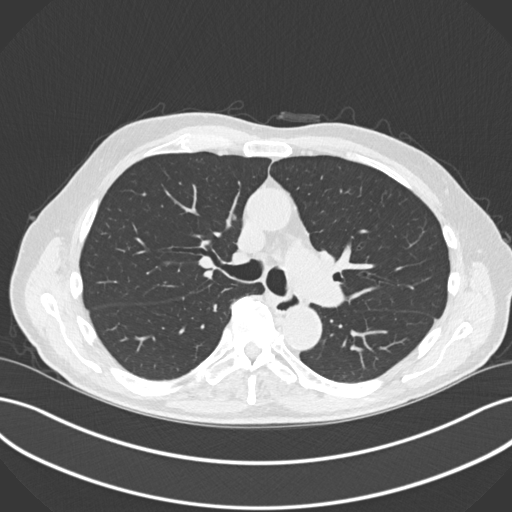
[im 118/171  lung]
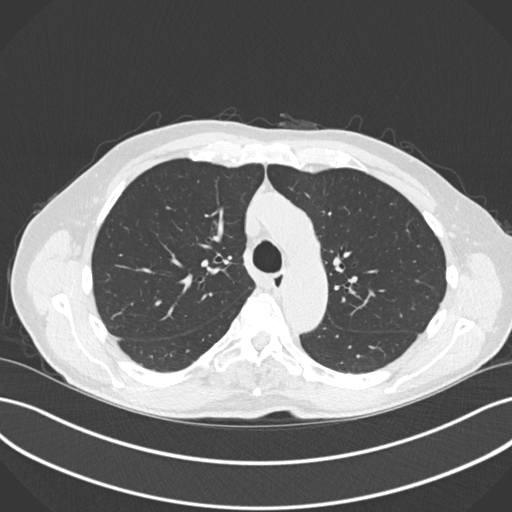
[im 131/171  lung]
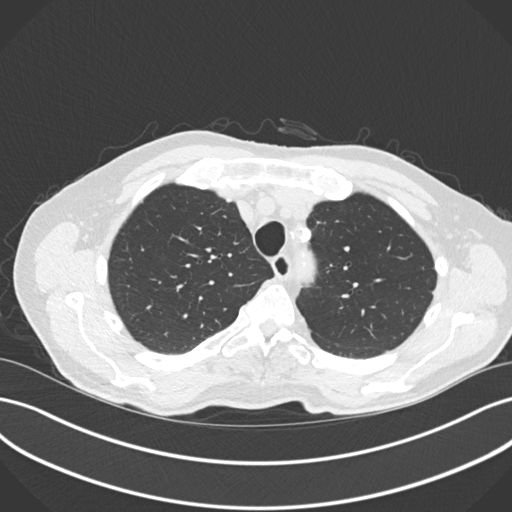
[im 144/171  mediastinal]
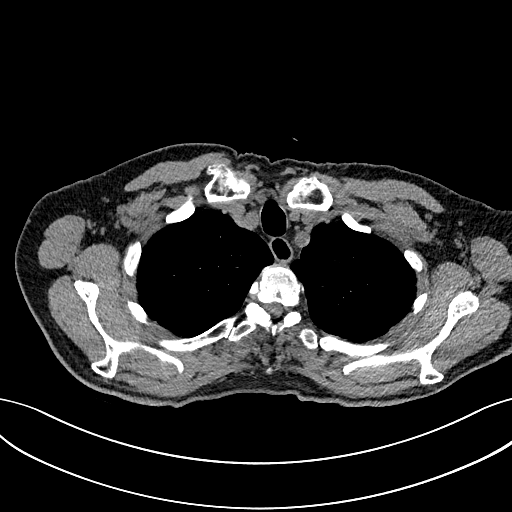
[im 144/171  lung]
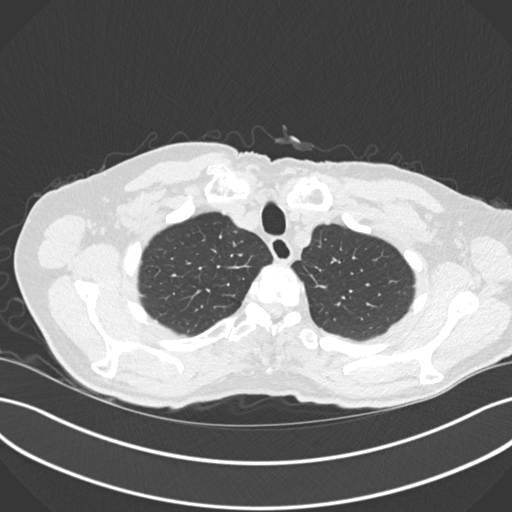
[im 157/171  lung]
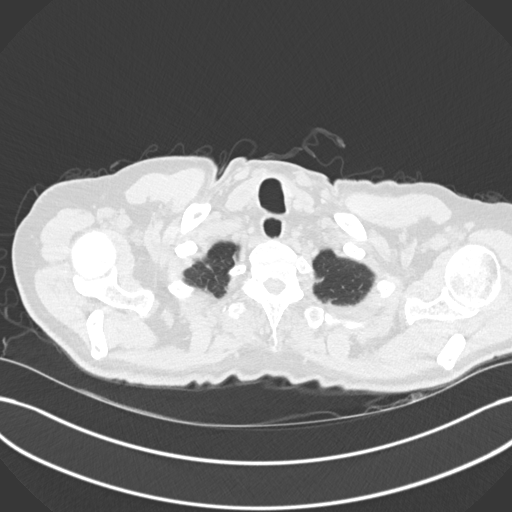

[14 of 32 positions shown; findings below may reference images not displayed]

FINDINGS: Cardiovascular: The heart size is normal. No substantial pericardial
effusion. Coronary artery calcification is evident. Atherosclerotic
calcification is noted in the wall of the thoracic aorta.

Mediastinum/Nodes: No mediastinal lymphadenopathy. No evidence for
gross hilar lymphadenopathy although assessment is limited by the
lack of intravenous contrast on today's study. The esophagus has
normal imaging features. There is no axillary lymphadenopathy.

Lungs/Pleura: Multiple tiny bilateral pulmonary nodules are again
identified. Dominant subpleural nodule in the left lower lobe
measures 8 x 9 mm today compared to 9 x 9 mm previously. Most of
these nodules are subpleural and likely represent lymph nodes. No
new suspicious pulmonary nodule or mass No focal airspace
consolidation. No pleural effusion.

Upper Abdomen: Unremarkable

Musculoskeletal: No worrisome lytic or sclerotic osseous
abnormality.
IMPRESSION: 1. Stable exam. Multiple tiny bilateral pulmonary nodules described
previously as stable back to 4801 are unchanged in the interval
since 4444. The 9 x 9 mm subpleural left lower lobe nodule of
concern on the previous study is unchanged in the interval and now
with documented stability back to PET-CT of 04/22/2017 providing 2
years of imaging follow-up, lesion is consistent with benign
etiology.
2. Aortic Atherosclerosis ([id number]).

## 2023-03-22 ENCOUNTER — Other Ambulatory Visit: Payer: Self-pay

## 2023-03-22 ENCOUNTER — Inpatient Hospital Stay (HOSPITAL_COMMUNITY)
Admission: EM | Admit: 2023-03-22 | Discharge: 2023-03-25 | DRG: 093 | Disposition: A | Discharge status: Home Health | Payer: Medicare Other | Attending: Internal Medicine | Admitting: Internal Medicine

## 2023-03-22 ENCOUNTER — Encounter (HOSPITAL_COMMUNITY): Payer: Self-pay | Admitting: Pharmacy Technician

## 2023-03-22 ENCOUNTER — Emergency Department (HOSPITAL_COMMUNITY): Payer: Medicare Other

## 2023-03-22 DIAGNOSIS — T420X1A Poisoning by hydantoin derivatives, accidental (unintentional), initial encounter: Secondary | ICD-10-CM | POA: Diagnosis not present

## 2023-03-22 DIAGNOSIS — R531 Weakness: Secondary | ICD-10-CM | POA: Diagnosis present

## 2023-03-22 DIAGNOSIS — Z87891 Personal history of nicotine dependence: Secondary | ICD-10-CM

## 2023-03-22 DIAGNOSIS — E039 Hypothyroidism, unspecified: Secondary | ICD-10-CM | POA: Diagnosis not present

## 2023-03-22 DIAGNOSIS — G40909 Epilepsy, unspecified, not intractable, without status epilepticus: Secondary | ICD-10-CM | POA: Diagnosis not present

## 2023-03-22 DIAGNOSIS — R296 Repeated falls: Secondary | ICD-10-CM | POA: Diagnosis not present

## 2023-03-22 DIAGNOSIS — T420X5A Adverse effect of hydantoin derivatives, initial encounter: Secondary | ICD-10-CM | POA: Diagnosis present

## 2023-03-22 DIAGNOSIS — Z79899 Other long term (current) drug therapy: Secondary | ICD-10-CM

## 2023-03-22 DIAGNOSIS — E785 Hyperlipidemia, unspecified: Secondary | ICD-10-CM | POA: Diagnosis present

## 2023-03-22 DIAGNOSIS — Z79890 Hormone replacement therapy (postmenopausal): Secondary | ICD-10-CM

## 2023-03-22 DIAGNOSIS — Z7982 Long term (current) use of aspirin: Secondary | ICD-10-CM

## 2023-03-22 DIAGNOSIS — W19XXXA Unspecified fall, initial encounter: Principal | ICD-10-CM

## 2023-03-22 DIAGNOSIS — R270 Ataxia, unspecified: Secondary | ICD-10-CM | POA: Diagnosis present

## 2023-03-22 LAB — BASIC METABOLIC PANEL
Anion gap: 10 (ref 5–15)
BUN: 13 mg/dL (ref 8–23)
CO2: 23 mmol/L (ref 22–32)
Calcium: 8.9 mg/dL (ref 8.9–10.3)
Chloride: 99 mmol/L (ref 98–111)
Creatinine, Ser: 0.87 mg/dL (ref 0.61–1.24)
GFR, Estimated: >60 mL/min (ref >60)
Glucose, Bld: 151 mg/dL — ABNORMAL HIGH (ref 70–99)
Potassium: 4 mmol/L (ref 3.5–5.1)
Sodium: 132 mmol/L — ABNORMAL LOW (ref 135–145)

## 2023-03-22 LAB — URINALYSIS, ROUTINE W REFLEX MICROSCOPIC
Bacteria, UA: NONE SEEN
Bilirubin Urine: NEGATIVE
Glucose, UA: NEGATIVE mg/dL
Ketones, ur: NEGATIVE mg/dL
Leukocytes,Ua: NEGATIVE
Nitrite: NEGATIVE
Protein, ur: NEGATIVE mg/dL
Specific Gravity, Urine: 1.01 (ref 1.005–1.030)
pH: 8 (ref 5.0–8.0)

## 2023-03-22 LAB — TROPONIN I (HIGH SENSITIVITY)
Troponin I (High Sensitivity): 7 ng/L (ref <18)
Troponin I (High Sensitivity): 11 ng/L (ref <18)

## 2023-03-22 LAB — CBC
HCT: 39.5 % (ref 39.0–52.0)
Hemoglobin: 13.7 g/dL (ref 13.0–17.0)
MCH: 34.9 pg — ABNORMAL HIGH (ref 26.0–34.0)
MCHC: 34.7 g/dL (ref 30.0–36.0)
MCV: 100.8 fL — ABNORMAL HIGH (ref 80.0–100.0)
Platelets: 221 10*3/uL (ref 150–400)
RBC: 3.92 MIL/uL — ABNORMAL LOW (ref 4.22–5.81)
RDW: 13.2 % (ref 11.5–15.5)
WBC: 9.1 10*3/uL (ref 4.0–10.5)
nRBC: 0 % (ref 0.0–0.2)

## 2023-03-22 LAB — PHENYTOIN LEVEL, TOTAL: Phenytoin Lvl: 35.3 ug/mL (ref 10.0–20.0)

## 2023-03-22 MED ORDER — ACETAMINOPHEN 325 MG PO TABS
650.0000 mg | ORAL_TABLET | Freq: Four times a day (QID) | ORAL | Status: DC | PRN
  Administered 2023-03-23: 650 mg via ORAL
  Filled 2023-03-22: qty 2

## 2023-03-22 MED ORDER — ALUM & MAG HYDROXIDE-SIMETH 200-200-20 MG/5ML PO SUSP
30.0000 mL | Freq: Once | ORAL | Status: AC
  Administered 2023-03-22: 30 mL via ORAL
  Filled 2023-03-22: qty 30

## 2023-03-22 MED ORDER — ONDANSETRON HCL 4 MG PO TABS: 4.0000 mg | ORAL_TABLET | Freq: Four times a day (QID) | ORAL | Status: DC | PRN

## 2023-03-22 MED ORDER — ONDANSETRON HCL 4 MG/2ML IJ SOLN: 4.0000 mg | Freq: Four times a day (QID) | INTRAMUSCULAR | Status: DC | PRN

## 2023-03-22 MED ORDER — GADOBUTROL 1 MMOL/ML IV SOLN
7.0000 mL | Freq: Once | INTRAVENOUS | Status: AC | PRN
  Administered 2023-03-22: 7 mL via INTRAVENOUS

## 2023-03-22 MED ORDER — ACETAMINOPHEN 650 MG RE SUPP: 650.0000 mg | Freq: Four times a day (QID) | RECTAL | Status: DC | PRN

## 2023-03-22 MED ORDER — LIDOCAINE VISCOUS HCL 2 % MT SOLN
15.0000 mL | Freq: Once | OROMUCOSAL | Status: AC
  Administered 2023-03-22: 15 mL via ORAL
  Filled 2023-03-22: qty 15

## 2023-03-22 MED ORDER — ENOXAPARIN SODIUM 40 MG/0.4ML IJ SOSY
40.0000 mg | PREFILLED_SYRINGE | INTRAMUSCULAR | Status: DC
  Administered 2023-03-24 – 2023-03-25 (×2): 40 mg via SUBCUTANEOUS
  Filled 2023-03-22 (×3): qty 0.4

## 2023-03-22 NOTE — ED Provider Notes (Signed)
[place] EMERGENCY DEPARTMENT AT [facility] Provider Note   CSN: [id number] Arrival date & time: 03/22/23  1442     History  Chief Complaint  Patient presents with   Weakness   Fall    Donald Robbins is a 82 y.o. male with medical history of seizures, thyroid disease, hyperlipidemia.  The patient presents to the ED for evaluation of multiple falls and generalized weakness.  The patient wife states that the patient has had 8 falls since Friday.  Patient reports that he has no chest pain or shortness of breath preceding these falls.  States that it is always when he is standing still and he will just become "unbalanced" and then fall over.  He is also endorsing generalized weakness that began on Friday.  Wife and husband states that before Friday the patient had maybe 1 fall in the last 2 months before that.  They are concerned that he is taking too much medication but they deny any new medications been added to his medication regimen since Friday.  He denies headedness or dizziness with these falls, she states that he feels "unbalanced".  He denies preceding chest pain or shortness of breath.  He denies one-sided weakness or numbness, fevers, nausea, vomiting, diarrhea, sore throat, body aches or chills, dysuria.   Weakness Associated symptoms: no dizziness   Fall       Home Medications Prior to Admission medications   Medication Sig Start Date End Date Taking? Authorizing Provider  aspirin EC 81 MG tablet Take 81 mg by mouth daily.    [provider]  Cholecalciferol (VITAMIN D3 PO) Take by mouth.    [provider]  levothyroxine (SYNTHROID, LEVOTHROID) 50 MCG tablet Take 50 mcg by mouth daily before breakfast.  01/26/17   [provider]  Melatonin 5 MG TABS Take 5 mg by mouth 2 (two) times daily.     [provider]  Multiple Vitamin (MULTIVITAMIN) tablet Take 1 tablet by mouth daily.    [provider]  Omega 3-6-9  Fatty Acids (TRIPLE OMEGA-3-6-9 PO) Take by mouth.    [provider]  Omega-3 Fatty Acids (FISH OIL PO) Take by mouth.    [provider]  phenytoin (DILANTIN) 100 MG ER capsule Take 100 mg by mouth 2 (two) times daily.    [provider]  simvastatin (ZOCOR) 20 MG tablet Take 20 mg by mouth daily.  01/26/17   [provider]      Allergies    Patient has no known allergies.    Review of Systems   Review of Systems  Neurological:  Positive for weakness. Negative for dizziness and light-headedness.  All other systems reviewed and are negative.   Physical Exam Updated Vital Signs BP 128/78   Pulse 67   Temp 98.2 F (36.8 C) (Oral)   Resp 16   SpO2 95%  Physical Exam Vitals and nursing note reviewed.  Constitutional:      General: He is not in acute distress.    Appearance: Normal appearance. He is not ill-appearing, toxic-appearing or diaphoretic.  HENT:     Head: Normocephalic and atraumatic.     Nose: Nose normal.     Mouth/Throat:     Mouth: Mucous membranes are moist.     Pharynx: Oropharynx is clear.  Eyes:     Extraocular Movements: Extraocular movements intact.     Conjunctiva/sclera: Conjunctivae normal.     Pupils: Pupils are equal, round, and reactive  to light.  Cardiovascular:     Rate and Rhythm: Normal rate and regular rhythm.  Pulmonary:     Effort: Pulmonary effort is normal.     Breath sounds: Normal breath sounds. No wheezing.  Abdominal:     General: Abdomen is flat. Bowel sounds are normal.     Palpations: Abdomen is soft.     Tenderness: There is no abdominal tenderness.  Musculoskeletal:     Cervical back: Normal range of motion and neck supple. No tenderness.  Skin:    General: Skin is warm and dry.     Capillary Refill: Capillary refill takes less than 2 seconds.  Neurological:     General: No focal deficit present.     Mental Status: He is alert and oriented to person, place, and time.     GCS: GCS eye  subscore is 4. GCS verbal subscore is 5. GCS motor subscore is 6.     Cranial Nerves: Cranial nerves 2-12 are intact. No cranial nerve deficit.     Sensory: Sensation is intact. No sensory deficit.     Motor: Motor function is intact. No weakness.     Coordination: Coordination is intact. Heel to [facility] Test normal.     Comments: CN II through XII intact.  Intact finger-nose, heel-to-shin.  No pronator drift.  No slurred speech.  5 out of 5 strength bilateral lower extremities.  5 out of 5 strength bilateral upper extremities.  Alert and oriented x 4.  No focal deficits.     ED Results / Procedures / Treatments   Labs (all labs ordered are listed, but only abnormal results are displayed) Labs Reviewed  CBC - Abnormal; Notable for the following components:      Result Value   RBC 3.92 (*)    MCV 100.8 (*)    MCH 34.9 (*)    All other components within normal limits  BASIC METABOLIC PANEL - Abnormal; Notable for the following components:   Sodium 132 (*)    Glucose, Bld 151 (*)    All other components within normal limits  URINALYSIS, ROUTINE W REFLEX MICROSCOPIC - Abnormal; Notable for the following components:   Hgb urine dipstick SMALL (*)    All other components within normal limits  TROPONIN I (HIGH SENSITIVITY)  TROPONIN I (HIGH SENSITIVITY)    EKG EKG Interpretation Date/Time:  Sunday March 22 2023 15:44:13 EDT Ventricular Rate:  66 PR Interval:  197 QRS Duration:  98 QT Interval:  390 QTC Calculation: 409 R Axis:   -54  Text Interpretation: Sinus rhythm Left anterior fascicular block ST elevation, consider inferior injury Baseline wander in lead(s) I II aVR Confirmed by Lorre Nick ([id number]) on 03/22/2023 5:33:52 PM  Radiology CT Head Wo Contrast  Result Date: 03/22/2023 CLINICAL DATA:  Head trauma, moderate-severe EXAM: CT HEAD WITHOUT CONTRAST TECHNIQUE: Contiguous axial images were obtained from the base of the skull through the vertex without intravenous  contrast. RADIATION DOSE REDUCTION: This exam was performed according to the departmental dose-optimization program which includes automated exposure control, adjustment of the mA and/or kV according to patient size and/or use of iterative reconstruction technique. COMPARISON:  None Available. FINDINGS: Brain: No hemorrhage. No hydrocephalus. No extra-axial fluid collection. No CT evidence of an acute cortical infarct. No mass effect. No mass lesion. Vascular: No hyperdense vessel or unexpected calcification. Skull: Normal. Negative for fracture or focal lesion. Sinuses/Orbits: No middle ear or mastoid effusion. Paranasal sinuses are clear. Orbits are unremarkable. Other: None. IMPRESSION:  No CT evidence of intracranial injury Electronically Signed   By: Lorenza Cambridge M.D.   On: 03/22/2023 16:08    Procedures Procedures   Medications Ordered in ED Medications  gadobutrol (GADAVIST) 1 MMOL/ML injection 7 mL (7 mLs Intravenous Contrast Given 03/22/23 1925)    ED Course/ Medical Decision Making/ A&P  Medical Decision Making Amount and/or Complexity of Data Reviewed Labs: ordered. Radiology: ordered.  Risk Prescription drug management.   82 year old who presents to the ED for evaluation.  Please see HPI for further details.  On examination patient is afebrile and nontachycardic.  Patient lung sounds are clear bilaterally, he is not hypoxic.  Abdomen soft compressible throughout with no tenderness.  Neurological examination is at baseline without focal neurodeficits.  He does have an abrasion to the bridge of his nose over the bridge of his nose as not tender and does not appear deformed.  No tenderness periorbitally. EOMs intact.  CBC without leukocytosis, no anemia.  Metabolic panel shows sodium 132, no other electrolyte derangement.  Troponin 7.  Urinalysis unremarkable.  CT head unremarkable.  Discussed with attending Dr. Freida Busman.  Will proceed with MRI of head to rule out CVA.  If MRI  unremarkable, patient will probably need boarding as patient wife reports she cannot take him home.  Will most likely need PT/OT consult and admission to SNF for rehab.  Patient signed out to Dr. Freida Busman.  Final Clinical Impression(s) / ED Diagnoses Final diagnoses:  Fall, initial encounter    Rx / DC Orders ED Discharge Orders     None         Clent Ridges 03/22/23 1941    Lorre Nick, MD 03/25/23 [phone]

## 2023-03-22 NOTE — ED Notes (Signed)
Pt back from MRI 

## 2023-03-22 NOTE — ED Notes (Signed)
Pt states relief in acid reflux

## 2023-03-22 NOTE — ED Notes (Addendum)
Pt to MRI

## 2023-03-22 NOTE — ED Notes (Signed)
ED TO INPATIENT HANDOFF REPORT  ED Nurse Name and Phone #: Thamas Jaegers Name/Age/Gender Donald Robbins 82 y.o. male Room/Bed: WA01/WA01  Code Status   Code Status: Full Code  Home/SNF/Other Home Patient oriented to: self, place, time, and situation Is this baseline? Yes   Triage Complete: Triage complete  Chief Complaint Phenytoin toxicity, accidental or unintentional, initial encounter [T42.0X1A]  Triage Note Pt bib family with complaints of generalized weakness and increased falls.    Allergies No Known Allergies  Level of Care/Admitting Diagnosis ED Disposition     ED Disposition  Admit   Condition  --   Comment  Hospital Area: [facility] [[id number]]  Level of Care: Med-Surg [16]  May place patient in observation at [facility] or [facility] if equivalent level of care is available:: No  Covid Evaluation: Asymptomatic - no recent exposure (last 10 days) testing not required  Diagnosis: Phenytoin toxicity, accidental or unintentional, initial encounter [[id number]]  Admitting Physician: Hillary Bow [[id number]]  Attending Physician: Hillary Bow [[id number]]          B Medical/Surgery History Past Medical History:  Diagnosis Date   Hyperlipidemia    Seizures (HCC) 2017 "blacked out"   last time 2018-blacked out per pt   Thyroid disease    Past Surgical History:  Procedure Laterality Date   COLONOSCOPY  09/04/2009     A IV Location/Drains/Wounds Patient Lines/Drains/Airways Status     Active Line/Drains/Airways     None            Intake/Output Last 24 hours No intake or output data in the 24 hours ending 03/22/23 2257  Labs/Imaging Results for orders placed or performed during the hospital encounter of 03/22/23 (from the past 48 hour(s))  CBC     Status: Abnormal   Collection Time: 03/22/23  3:14 PM  Result Value Ref Range   WBC 9.1 4.0 - 10.5 K/uL   RBC 3.92 (L) 4.22 - 5.81 MIL/uL   Hemoglobin 13.7 13.0 - 17.0 g/dL    HCT 32.4 40.1 - 02.7 %   MCV 100.8 (H) 80.0 - 100.0 fL   MCH 34.9 (H) 26.0 - 34.0 pg   MCHC 34.7 30.0 - 36.0 g/dL   RDW 25.3 66.4 - 40.3 %   Platelets 221 150 - 400 K/uL   nRBC 0.0 0.0 - 0.2 %    Comment: Performed at [facility], [address], [place], [place] [address]  Basic metabolic panel     Status: Abnormal   Collection Time: 03/22/23  3:14 PM  Result Value Ref Range   Sodium 132 (L) 135 - 145 mmol/L   Potassium 4.0 3.5 - 5.1 mmol/L   Chloride 99 98 - 111 mmol/L   CO2 23 22 - 32 mmol/L   Glucose, Bld 151 (H) 70 - 99 mg/dL    Comment: Glucose reference range applies only to samples taken after fasting for at least 8 hours.   BUN 13 8 - 23 mg/dL   Creatinine, Ser 9.56 0.61 - 1.24 mg/dL   Calcium 8.9 8.9 - 38.7 mg/dL   GFR, Estimated >56 >43 mL/min    Comment: (NOTE) Calculated using the CKD-EPI Creatinine Equation (2021)    Anion gap 10 5 - 15    Comment: Performed at [facility], [address], [place], [place] [address]  Troponin I (High Sensitivity)     Status: None   Collection Time: 03/22/23  3:14 PM  Result Value Ref Range   Troponin I (High Sensitivity) 7 <18 ng/L    Comment: (NOTE) Elevated high sensitivity troponin I (hsTnI) values and significant  changes across serial measurements may suggest ACS but many other  chronic and acute conditions are known to elevate hsTnI results.  Refer to the "Links" section for chest pain algorithms and additional  guidance. Performed at [facility], [address], [place], [place] [address]   Urinalysis, Routine w reflex microscopic -Urine, Clean Catch     Status: Abnormal   Collection Time: 03/22/23  4:53 PM  Result Value Ref Range   Color, Urine YELLOW YELLOW   APPearance CLEAR CLEAR   Specific Gravity, Urine 1.010 1.005 - 1.030   pH 8.0 5.0 - 8.0   Glucose, UA NEGATIVE NEGATIVE mg/dL   Hgb urine dipstick SMALL (A) NEGATIVE   Bilirubin Urine NEGATIVE  NEGATIVE   Ketones, ur NEGATIVE NEGATIVE mg/dL   Protein, ur NEGATIVE NEGATIVE mg/dL   Nitrite NEGATIVE NEGATIVE   Leukocytes,Ua NEGATIVE NEGATIVE   RBC / HPF 0-5 0 - 5 RBC/hpf   WBC, UA 0-5 0 - 5 WBC/hpf   Bacteria, UA NONE SEEN NONE SEEN   Squamous Epithelial / HPF 0-5 0 - 5 /HPF   Mucus PRESENT     Comment: Performed at [facility], [address], [place], [place] [address]  Troponin I (High Sensitivity)     Status: None   Collection Time: 03/22/23  7:57 PM  Result Value Ref Range   Troponin I (High Sensitivity) 11 <18 ng/L    Comment: (NOTE) Elevated high sensitivity troponin I (hsTnI) values and significant  changes across serial measurements may suggest ACS but many other  chronic and acute conditions are known to elevate hsTnI results.  Refer to the "Links" section for chest pain algorithms and additional  guidance. Performed at [facility], [address] [place], [place] [address]   Phenytoin level, total     Status: Abnormal   Collection Time: 03/22/23  8:41 PM  Result Value Ref Range   Phenytoin Lvl 35.3 (HH) 10.0 - 20.0 ug/mL    Comment: RESULT CONFIRMED BY MANUAL DILUTION CRITICAL RESULT CALLED TO, READ BACK BY AND VERIFIED WITH GRAY S. @2235  03/22/23 MCLEAN K. Performed at [facility], [address], [place], [place] [address]    MR BRAIN W WO CONTRAST  Result Date: 03/22/2023 CLINICAL DATA:  Ataxia, head trauma worsening dizziness and falls, unremarkable ct head, rule out cva. EXAM: MRI HEAD WITHOUT AND WITH CONTRAST TECHNIQUE: Multiplanar, multiecho pulse sequences of the brain and surrounding structures were obtained without and with intravenous contrast. CONTRAST:  7mL GADAVIST GADOBUTROL 1 MMOL/ML IV SOLN COMPARISON:  Head CT 03/22/2023. FINDINGS: Brain: No acute infarct or hemorrhage. No hydrocephalus or extra-axial collection. No foci of abnormal susceptibility. No mass or abnormal enhancement.  Vascular: Normal flow voids and vessel enhancement. Skull and upper cervical spine: Normal marrow signal and enhancement. Sinuses/Orbits: No acute findings. Other: None. IMPRESSION: Normal brain MRI. Electronically Signed   By: Orvan Falconer M.D.   On: 03/22/2023 19:43   CT Head Wo Contrast  Result Date: 03/22/2023 CLINICAL DATA:  Head trauma, moderate-severe EXAM: CT HEAD WITHOUT CONTRAST TECHNIQUE: Contiguous axial images were obtained from the base of the skull through the vertex without intravenous contrast. RADIATION DOSE REDUCTION: This exam was performed according to the departmental dose-optimization program which includes automated exposure control, adjustment of the mA and/or kV  according to patient size and/or use of iterative reconstruction technique. COMPARISON:  None Available. FINDINGS: Brain: No hemorrhage. No hydrocephalus. No extra-axial fluid collection. No CT evidence of an acute cortical infarct. No mass effect. No mass lesion. Vascular: No hyperdense vessel or unexpected calcification. Skull: Normal. Negative for fracture or focal lesion. Sinuses/Orbits: No middle ear or mastoid effusion. Paranasal sinuses are clear. Orbits are unremarkable. Other: None. IMPRESSION: No CT evidence of intracranial injury Electronically Signed   By: Lorenza Cambridge M.D.   On: 03/22/2023 16:08    Pending Labs Unresulted Labs (From admission, onward)     Start     Ordered   03/23/23 0500  Phenytoin level, total  Tomorrow morning,   R        03/22/23 2256            Vitals/Pain Today's Vitals   03/22/23 1800 03/22/23 1830 03/22/23 1941 03/22/23 2100  BP: 130/79 128/78 128/73 120/75  Pulse: 71 67 68 64  Resp: 17 16 17 18   Temp:   98 F (36.7 C)   TempSrc:   Oral   SpO2: 97% 95% 97% 97%  PainSc:        Isolation Precautions No active isolations  Medications Medications  enoxaparin (LOVENOX) injection 40 mg (has no administration in time range)  acetaminophen (TYLENOL) tablet 650  mg (has no administration in time range)    Or  acetaminophen (TYLENOL) suppository 650 mg (has no administration in time range)  ondansetron (ZOFRAN) tablet 4 mg (has no administration in time range)    Or  ondansetron (ZOFRAN) injection 4 mg (has no administration in time range)  gadobutrol (GADAVIST) 1 MMOL/ML injection 7 mL (7 mLs Intravenous Contrast Given 03/22/23 1925)  alum & mag hydroxide-simeth (MAALOX/MYLANTA) 200-200-20 MG/5ML suspension 30 mL (30 mLs Oral Given 03/22/23 2052)    And  lidocaine (XYLOCAINE) 2 % viscous mouth solution 15 mL (15 mLs Oral Given 03/22/23 2052)    Mobility Walks with device     Focused Assessments     R Recommendations: See Admitting Provider Note  Report given to:   Additional Notes:

## 2023-03-22 NOTE — ED Notes (Signed)
Pt requesting medication for acid reflux. Dr. Freida Busman notified

## 2023-03-22 NOTE — Assessment & Plan Note (Signed)
Sounds like this was suspected / diagnosed clinically based on syncope with tremors for a couple of episodes back in 2017 or so. Started on phenytoin for this, and since then has had no further episodes in the past 6 years or so. But pt not seeing neurologist, this is being managed by his PCP who is also in his 3s.  Family would like pt to be referred to neurologist to see as outpt. Likely refer to neurology at time of discharge (again assuming ataxia and such resolve with normalization of phenytoin level). Obviously if persistent neurologic symptoms despite normalization of phenytoin level, then would need to involve neurology here in hospital.

## 2023-03-22 NOTE — Assessment & Plan Note (Signed)
Check TSH  Cont Synthroid

## 2023-03-22 NOTE — ED Provider Notes (Addendum)
I provided a substantive portion of the care of this patient.  I personally made/approved the management plan for this patient and take responsibility for the patient management.  EKG Interpretation Date/Time:  Sunday March 22 2023 15:44:13 EDT Ventricular Rate:  66 PR Interval:  197 QRS Duration:  98 QT Interval:  390 QTC Calculation: 409 R Axis:   -54  Text Interpretation: Sinus rhythm Left anterior fascicular block ST elevation, consider inferior injury Baseline wander in lead(s) I II aVR Confirmed by Lorre Nick ([id number]) on 03/22/2023 5:60:63 PM   82 year old male presents with gait instability x 2 days.  History of same in the past with unclear etiology.  Head CT negative here.  Physical exam unremarkable.  Will perform MRI and based on those findings he may need to have a PT consult  10:44 PM Patient is on Dilantin and level is 35.3 which is in the toxic range.  This would explain the patient's ataxia.  Will hold patient's Dilantin at this time.  Will admit to the medicine service for observation overnight   Lorre Nick, MD 03/22/23 1750    Lorre Nick, MD 03/22/23 2244

## 2023-03-22 NOTE — ED Triage Notes (Signed)
Pt bib family with complaints of generalized weakness and increased falls.

## 2023-03-22 NOTE — Assessment & Plan Note (Signed)
Pt with ataxia, falls, and inability to ambulate. This is most likely related to fact that phenytoin level well into toxic range at 35.3. MRI brain neg for acute stroke.  Hold phenytoin PT/OT Repeat phenytoin level in AM Presumably will need to resume phenytoin at lower dose once levels are back into theraputic range.

## 2023-03-22 NOTE — H&P (Signed)
History and Physical    Patient: Donald Robbins BMW:[id number] DOB: 1941/02/04 DOA: 03/22/2023 DOS: the patient was seen and examined on 03/22/2023 PCP: Burton Apley, MD  Patient coming from: Home  Chief Complaint:  Chief Complaint  Patient presents with   Weakness   Fall   HPI: Donald Robbins is a 82 y.o. male with medical history significant of Seizure disorder on phenytoin.  Pt in to ED with new onset of falls and gait instability since Friday.  The patient wife states that the patient has had 8 falls since Friday. Patient reports that he has no chest pain or shortness of breath preceding these falls. States that it is always when he is standing still and he will just become "unbalanced" and then fall over. He is also endorsing generalized weakness that began on Friday. Wife and husband states that before Friday the patient had maybe 1 fall in the last 2 months before that. They are concerned that he is taking too much medication but they deny any new medications been added to his medication regimen since Friday. He denies headedness or dizziness with these falls, she states that he feels "unbalanced". He denies preceding chest pain or shortness of breath. He denies one-sided weakness or numbness, fevers, nausea, vomiting, diarrhea, sore throat, body aches or chills, dysuria.   In ED:  MRI brain neg Phenytoin level however is well in to the toxic range at 35.3! Review of Systems: As mentioned in the history of present illness. All other systems reviewed and are negative. Past Medical History:  Diagnosis Date   Hyperlipidemia    Seizures (HCC) 2017 "blacked out"   last time 2018-blacked out per pt   Thyroid disease    Past Surgical History:  Procedure Laterality Date   COLONOSCOPY  09/04/2009   Social History:  reports that he quit smoking about 52 years ago. His smoking use included cigarettes. He started smoking about 62 years ago. He has a 5 pack-year smoking  history. He has never used smokeless tobacco. He reports current alcohol use of about 1.0 standard drink of alcohol per week. He reports that he does not use drugs.  No Known Allergies  Family History  Problem Relation Age of Onset   Prostate cancer Father    Colon cancer Neg Hx    Colon polyps Neg Hx    Esophageal cancer Neg Hx    Rectal cancer Neg Hx    Stomach cancer Neg Hx     Prior to Admission medications   Medication Sig Start Date End Date Taking? Authorizing Provider  aspirin EC 81 MG tablet Take 81 mg by mouth daily.    [provider]  Cholecalciferol (VITAMIN D3 PO) Take by mouth.    [provider]  levothyroxine (SYNTHROID, LEVOTHROID) 50 MCG tablet Take 50 mcg by mouth daily before breakfast.  01/26/17   [provider]  Melatonin 5 MG TABS Take 5 mg by mouth 2 (two) times daily.     [provider]  Multiple Vitamin (MULTIVITAMIN) tablet Take 1 tablet by mouth daily.    [provider]  Omega 3-6-9 Fatty Acids (TRIPLE OMEGA-3-6-9 PO) Take by mouth.    [provider]  Omega-3 Fatty Acids (FISH OIL PO) Take by mouth.    [provider]  phenytoin (DILANTIN) 100 MG ER capsule Take 100 mg by mouth 2 (two) times daily.    [provider]  simvastatin (ZOCOR) 20 MG tablet Take 20 mg by  mouth daily.  01/26/17   [provider]    Physical Exam: Vitals:   03/22/23 1800 03/22/23 1830 03/22/23 1941 03/22/23 2100  BP: 130/79 128/78 128/73 120/75  Pulse: 71 67 68 64  Resp: 17 16 17 18   Temp:   98 F (36.7 C)   TempSrc:   Oral   SpO2: 97% 95% 97% 97%   Constitutional: NAD, calm, comfortable Respiratory: clear to auscultation bilaterally, no wheezing, no crackles. Normal respiratory effort. No accessory muscle use.  Cardiovascular: Regular rate and rhythm, no murmurs / rubs / gallops. No extremity edema. 2+ pedal pulses. No carotid bruits.  Abdomen: no tenderness, no masses palpated. No  hepatosplenomegaly. Bowel sounds positive.  Neurologic: Generalized weakness and ataxia present Psychiatric: Normal judgment and insight. Alert and oriented x 3. Normal mood.   Data Reviewed:    Labs on Admission: I have personally reviewed following labs and imaging studies  CBC: Recent Labs  Lab 03/22/23 1514  WBC 9.1  HGB 13.7  HCT 39.5  MCV 100.8*  PLT 221   Basic Metabolic Panel: Recent Labs  Lab 03/22/23 1514  NA 132*  K 4.0  CL 99  CO2 23  GLUCOSE 151*  BUN 13  CREATININE 0.87  CALCIUM 8.9   GFR: CrCl cannot be calculated (Unknown ideal weight.). Liver Function Tests: No results for input(s): "AST", "ALT", "ALKPHOS", "BILITOT", "PROT", "ALBUMIN" in the last 168 hours. No results for input(s): "LIPASE", "AMYLASE" in the last 168 hours. No results for input(s): "AMMONIA" in the last 168 hours. Coagulation Profile: No results for input(s): "INR", "PROTIME" in the last 168 hours. Cardiac Enzymes: No results for input(s): "CKTOTAL", "CKMB", "CKMBINDEX", "TROPONINI" in the last 168 hours. BNP (last 3 results) No results for input(s): "PROBNP" in the last 8760 hours. HbA1C: No results for input(s): "HGBA1C" in the last 72 hours. CBG: No results for input(s): "GLUCAP" in the last 168 hours. Lipid Profile: No results for input(s): "CHOL", "HDL", "LDLCALC", "TRIG", "CHOLHDL", "LDLDIRECT" in the last 72 hours. Thyroid Function Tests: No results for input(s): "TSH", "T4TOTAL", "FREET4", "T3FREE", "THYROIDAB" in the last 72 hours. Anemia Panel: No results for input(s): "VITAMINB12", "FOLATE", "FERRITIN", "TIBC", "IRON", "RETICCTPCT" in the last 72 hours. Urine analysis:    Component Value Date/Time   COLORURINE YELLOW 03/22/2023 1653   APPEARANCEUR CLEAR 03/22/2023 1653   LABSPEC 1.010 03/22/2023 1653   PHURINE 8.0 03/22/2023 1653   GLUCOSEU NEGATIVE 03/22/2023 1653   HGBUR SMALL (A) 03/22/2023 1653   BILIRUBINUR NEGATIVE 03/22/2023 1653   KETONESUR NEGATIVE  03/22/2023 1653   PROTEINUR NEGATIVE 03/22/2023 1653   NITRITE NEGATIVE 03/22/2023 1653   LEUKOCYTESUR NEGATIVE 03/22/2023 1653    Radiological Exams on Admission: MR BRAIN W WO CONTRAST  Result Date: 03/22/2023 CLINICAL DATA:  Ataxia, head trauma worsening dizziness and falls, unremarkable ct head, rule out cva. EXAM: MRI HEAD WITHOUT AND WITH CONTRAST TECHNIQUE: Multiplanar, multiecho pulse sequences of the brain and surrounding structures were obtained without and with intravenous contrast. CONTRAST:  7mL GADAVIST GADOBUTROL 1 MMOL/ML IV SOLN COMPARISON:  Head CT 03/22/2023. FINDINGS: Brain: No acute infarct or hemorrhage. No hydrocephalus or extra-axial collection. No foci of abnormal susceptibility. No mass or abnormal enhancement. Vascular: Normal flow voids and vessel enhancement. Skull and upper cervical spine: Normal marrow signal and enhancement. Sinuses/Orbits: No acute findings. Other: None. IMPRESSION: Normal brain MRI. Electronically Signed   By: Orvan Falconer M.D.   On: 03/22/2023 19:43   CT Head Wo Contrast  Result Date: 03/22/2023  CLINICAL DATA:  Head trauma, moderate-severe EXAM: CT HEAD WITHOUT CONTRAST TECHNIQUE: Contiguous axial images were obtained from the base of the skull through the vertex without intravenous contrast. RADIATION DOSE REDUCTION: This exam was performed according to the departmental dose-optimization program which includes automated exposure control, adjustment of the mA and/or kV according to patient size and/or use of iterative reconstruction technique. COMPARISON:  None Available. FINDINGS: Brain: No hemorrhage. No hydrocephalus. No extra-axial fluid collection. No CT evidence of an acute cortical infarct. No mass effect. No mass lesion. Vascular: No hyperdense vessel or unexpected calcification. Skull: Normal. Negative for fracture or focal lesion. Sinuses/Orbits: No middle ear or mastoid effusion. Paranasal sinuses are clear. Orbits are unremarkable.  Other: None. IMPRESSION: No CT evidence of intracranial injury Electronically Signed   By: Lorenza Cambridge M.D.   On: 03/22/2023 16:08    EKG: Independently reviewed.   Assessment and Plan: * Phenytoin toxicity, accidental or unintentional, initial encounter Pt with ataxia, falls, and inability to ambulate. This is most likely related to fact that phenytoin level well into toxic range at 35.3. MRI brain neg for acute stroke.  Hold phenytoin PT/OT Repeat phenytoin level in AM Presumably will need to resume phenytoin at lower dose once levels are back into theraputic range.   Seizure disorder [facility]) Sounds like this was suspected / diagnosed clinically based on syncope with tremors for a couple of episodes back in 2017 or so. Started on phenytoin for this, and since then has had no further episodes in the past 6 years or so. But pt not seeing neurologist, this is being managed by his PCP who is also in his 69s.  Family would like pt to be referred to neurologist to see as outpt. Likely refer to neurology at time of discharge (again assuming ataxia and such resolve with normalization of phenytoin level). Obviously if persistent neurologic symptoms despite normalization of phenytoin level, then would need to involve neurology here in hospital.  Hypothyroidism Check TSH Cont Synthroid      Advance Care Planning:   Code Status: Full Code  Consults: None  Family Communication: Family at bedside  Severity of Illness: The appropriate patient status for this patient is OBSERVATION. Observation status is judged to be reasonable and necessary in order to provide the required intensity of service to ensure the patient's safety. The patient's presenting symptoms, physical exam findings, and initial radiographic and laboratory data in the context of their medical condition is felt to place them at decreased risk for further clinical deterioration. Furthermore, it is anticipated that the patient  will be medically stable for discharge from the hospital within 2 midnights of admission.   Author: Hillary Bow., DO 03/22/2023 11:04 PM  For on call review [email or web address].

## 2023-03-23 DIAGNOSIS — E785 Hyperlipidemia, unspecified: Secondary | ICD-10-CM | POA: Diagnosis present

## 2023-03-23 DIAGNOSIS — T420X1A Poisoning by hydantoin derivatives, accidental (unintentional), initial encounter: Secondary | ICD-10-CM | POA: Diagnosis present

## 2023-03-23 DIAGNOSIS — R531 Weakness: Secondary | ICD-10-CM | POA: Diagnosis present

## 2023-03-23 DIAGNOSIS — R296 Repeated falls: Secondary | ICD-10-CM | POA: Diagnosis present

## 2023-03-23 DIAGNOSIS — E038 Other specified hypothyroidism: Secondary | ICD-10-CM | POA: Diagnosis not present

## 2023-03-23 DIAGNOSIS — G40909 Epilepsy, unspecified, not intractable, without status epilepticus: Secondary | ICD-10-CM | POA: Diagnosis present

## 2023-03-23 DIAGNOSIS — Z79899 Other long term (current) drug therapy: Secondary | ICD-10-CM | POA: Diagnosis not present

## 2023-03-23 DIAGNOSIS — Z79890 Hormone replacement therapy (postmenopausal): Secondary | ICD-10-CM | POA: Diagnosis not present

## 2023-03-23 DIAGNOSIS — T420X5A Adverse effect of hydantoin derivatives, initial encounter: Secondary | ICD-10-CM | POA: Diagnosis present

## 2023-03-23 DIAGNOSIS — R270 Ataxia, unspecified: Secondary | ICD-10-CM | POA: Diagnosis present

## 2023-03-23 DIAGNOSIS — Z7982 Long term (current) use of aspirin: Secondary | ICD-10-CM | POA: Diagnosis not present

## 2023-03-23 DIAGNOSIS — Z87891 Personal history of nicotine dependence: Secondary | ICD-10-CM | POA: Diagnosis not present

## 2023-03-23 DIAGNOSIS — E039 Hypothyroidism, unspecified: Secondary | ICD-10-CM | POA: Diagnosis present

## 2023-03-23 LAB — PHENYTOIN LEVEL, TOTAL: Phenytoin Lvl: 37.3 ug/mL (ref 10.0–20.0)

## 2023-03-23 LAB — TSH: TSH: 1.644 u[IU]/mL (ref 0.350–4.500)

## 2023-03-23 MED ORDER — ROSUVASTATIN CALCIUM 20 MG PO TABS
20.0000 mg | ORAL_TABLET | Freq: Every day | ORAL | Status: DC
  Administered 2023-03-23 – 2023-03-25 (×3): 20 mg via ORAL
  Filled 2023-03-23 (×3): qty 1

## 2023-03-23 MED ORDER — LEVOTHYROXINE SODIUM 50 MCG PO TABS
50.0000 ug | ORAL_TABLET | Freq: Every day | ORAL | Status: DC
  Administered 2023-03-23 – 2023-03-25 (×3): 50 ug via ORAL
  Filled 2023-03-23 (×4): qty 1

## 2023-03-23 NOTE — ED Notes (Signed)
ED TO INPATIENT HANDOFF REPORT  Name/Age/Gender Donald Robbins 82 y.o. male  Code Status    Code Status Orders  (From admission, onward)           Start     Ordered   03/22/23 2255  Full code  Continuous       Question:  By:  Answer:  Default: patient does not have capacity for decision making, no surrogate or prior directive available   03/22/23 2256           Code Status History     This patient has a current code status but no historical code status.       Home/SNF/Other Home  Chief Complaint Phenytoin toxicity, accidental or unintentional, initial encounter [T42.0X1A]  Level of Care/Admitting Diagnosis ED Disposition     ED Disposition  Admit   Condition  --   Comment  Hospital Area: [facility] [[id number]]  Level of Care: Med-Surg [16]  May place patient in observation at [facility] or [facility] if equivalent level of care is available:: No  Covid Evaluation: Asymptomatic - no recent exposure (last 10 days) testing not required  Diagnosis: Phenytoin toxicity, accidental or unintentional, initial encounter [[id number]]  Admitting Physician: Hillary Bow [[id number]]  Attending Physician: Hillary Bow [phone]          Medical History Past Medical History:  Diagnosis Date   Hyperlipidemia    Seizures (HCC) 2017 "blacked out"   last time 2018-blacked out per pt   Thyroid disease     Allergies No Known Allergies  IV Location/Drains/Wounds Patient Lines/Drains/Airways Status     Active Line/Drains/Airways     Name Placement date Placement time Site Days   Peripheral IV 03/23/23 Posterior;Right Forearm 03/23/23  0755  Forearm  less than 1            Labs/Imaging Results for orders placed or performed during the hospital encounter of 03/22/23 (from the past 48 hour(s))  CBC     Status: Abnormal   Collection Time: 03/22/23  3:14 PM  Result Value Ref Range   WBC 9.1 4.0 - 10.5 K/uL   RBC 3.92 (L) 4.22 - 5.81  MIL/uL   Hemoglobin 13.7 13.0 - 17.0 g/dL   HCT 69.6 29.5 - 28.4 %   MCV 100.8 (H) 80.0 - 100.0 fL   MCH 34.9 (H) 26.0 - 34.0 pg   MCHC 34.7 30.0 - 36.0 g/dL   RDW 13.2 44.0 - 10.2 %   Platelets 221 150 - 400 K/uL   nRBC 0.0 0.0 - 0.2 %    Comment: Performed at [facility], [address] [place], [place] [address]  Basic metabolic panel     Status: Abnormal   Collection Time: 03/22/23  3:14 PM  Result Value Ref Range   Sodium 132 (L) 135 - 145 mmol/L   Potassium 4.0 3.5 - 5.1 mmol/L   Chloride 99 98 - 111 mmol/L   CO2 23 22 - 32 mmol/L   Glucose, Bld 151 (H) 70 - 99 mg/dL    Comment: Glucose reference range applies only to samples taken after fasting for at least 8 hours.   BUN 13 8 - 23 mg/dL   Creatinine, Ser 6.44 0.61 - 1.24 mg/dL   Calcium 8.9 8.9 - 03.4 mg/dL   GFR, Estimated >74 >25 mL/min    Comment: (NOTE) Calculated using the CKD-EPI Creatinine Equation (2021)    Anion gap 10 5 -  15    Comment: Performed at [facility], [address], [place], [place] [address]  Troponin I (High Sensitivity)     Status: None   Collection Time: 03/22/23  3:14 PM  Result Value Ref Range   Troponin I (High Sensitivity) 7 <18 ng/L    Comment: (NOTE) Elevated high sensitivity troponin I (hsTnI) values and significant  changes across serial measurements may suggest ACS but many other  chronic and acute conditions are known to elevate hsTnI results.  Refer to the "Links" section for chest pain algorithms and additional  guidance. Performed at [facility], [address], [place], [place] [address]   Urinalysis, Routine w reflex microscopic -Urine, Clean Catch     Status: Abnormal   Collection Time: 03/22/23  4:53 PM  Result Value Ref Range   Color, Urine YELLOW YELLOW   APPearance CLEAR CLEAR   Specific Gravity, Urine 1.010 1.005 - 1.030   pH 8.0 5.0 - 8.0   Glucose, UA NEGATIVE NEGATIVE mg/dL   Hgb urine dipstick SMALL  (A) NEGATIVE   Bilirubin Urine NEGATIVE NEGATIVE   Ketones, ur NEGATIVE NEGATIVE mg/dL   Protein, ur NEGATIVE NEGATIVE mg/dL   Nitrite NEGATIVE NEGATIVE   Leukocytes,Ua NEGATIVE NEGATIVE   RBC / HPF 0-5 0 - 5 RBC/hpf   WBC, UA 0-5 0 - 5 WBC/hpf   Bacteria, UA NONE SEEN NONE SEEN   Squamous Epithelial / HPF 0-5 0 - 5 /HPF   Mucus PRESENT     Comment: Performed at [facility], [address], [place], [place] [address]  Troponin I (High Sensitivity)     Status: None   Collection Time: 03/22/23  7:57 PM  Result Value Ref Range   Troponin I (High Sensitivity) 11 <18 ng/L    Comment: (NOTE) Elevated high sensitivity troponin I (hsTnI) values and significant  changes across serial measurements may suggest ACS but many other  chronic and acute conditions are known to elevate hsTnI results.  Refer to the "Links" section for chest pain algorithms and additional  guidance. Performed at [facility], [address], [place], [place] [address]   Phenytoin level, total     Status: Abnormal   Collection Time: 03/22/23  8:41 PM  Result Value Ref Range   Phenytoin Lvl 35.3 (HH) 10.0 - 20.0 ug/mL    Comment: RESULT CONFIRMED BY MANUAL DILUTION CRITICAL RESULT CALLED TO, READ BACK BY AND VERIFIED WITH GRAY S. @2235  03/22/23 MCLEAN K. Performed at [facility], [address], [place], [place] [address]   TSH     Status: None   Collection Time: 03/22/23 11:56 PM  Result Value Ref Range   TSH 1.644 0.350 - 4.500 uIU/mL    Comment: Performed by a 3rd Generation assay with a functional sensitivity of <=0.01 uIU/mL. Performed at [facility], [address] [place], [place] [address]   Phenytoin level, total     Status: Abnormal   Collection Time: 03/23/23  7:36 AM  Result Value Ref Range   Phenytoin Lvl 37.3 (HH) 10.0 - 20.0 ug/mL    Comment: RESULT CONFIRMED BY MANUAL DILUTION CRITICAL RESULT CALLED TO, READ BACK  BY AND VERIFIED WITH Akeem Heppler, L. RN AT 5784 ON 10/14/024 BY MECIAL J. Performed at [facility], [address] [place], [place] [address]    MR BRAIN W WO CONTRAST  Result Date: 03/22/2023 CLINICAL DATA:  Ataxia, head trauma worsening dizziness  and falls, unremarkable ct head, rule out cva. EXAM: MRI HEAD WITHOUT AND WITH CONTRAST TECHNIQUE: Multiplanar, multiecho pulse sequences of the brain and surrounding structures were obtained without and with intravenous contrast. CONTRAST:  7mL GADAVIST GADOBUTROL 1 MMOL/ML IV SOLN COMPARISON:  Head CT 03/22/2023. FINDINGS: Brain: No acute infarct or hemorrhage. No hydrocephalus or extra-axial collection. No foci of abnormal susceptibility. No mass or abnormal enhancement. Vascular: Normal flow voids and vessel enhancement. Skull and upper cervical spine: Normal marrow signal and enhancement. Sinuses/Orbits: No acute findings. Other: None. IMPRESSION: Normal brain MRI. Electronically Signed   By: Orvan Falconer M.D.   On: 03/22/2023 19:43   CT Head Wo Contrast  Result Date: 03/22/2023 CLINICAL DATA:  Head trauma, moderate-severe EXAM: CT HEAD WITHOUT CONTRAST TECHNIQUE: Contiguous axial images were obtained from the base of the skull through the vertex without intravenous contrast. RADIATION DOSE REDUCTION: This exam was performed according to the departmental dose-optimization program which includes automated exposure control, adjustment of the mA and/or kV according to patient size and/or use of iterative reconstruction technique. COMPARISON:  None Available. FINDINGS: Brain: No hemorrhage. No hydrocephalus. No extra-axial fluid collection. No CT evidence of an acute cortical infarct. No mass effect. No mass lesion. Vascular: No hyperdense vessel or unexpected calcification. Skull: Normal. Negative for fracture or focal lesion. Sinuses/Orbits: No middle ear or mastoid effusion. Paranasal sinuses are clear. Orbits are unremarkable. Other:  None. IMPRESSION: No CT evidence of intracranial injury Electronically Signed   By: Lorenza Cambridge M.D.   On: 03/22/2023 16:08    Pending Labs Unresulted Labs (From admission, onward)     Start     Ordered   03/24/23 0500  Phenytoin level, total  Daily,   R      03/23/23 0911            Vitals/Pain Today's Vitals   03/23/23 0530 03/23/23 0601 03/23/23 0700 03/23/23 0954  BP: (!) 108/92 (!) 97/49 123/83 (!) 144/75  Pulse: 70 65 62 68  Resp: 16 18  18   Temp:  98.3 F (36.8 C)  97.6 F (36.4 C)  TempSrc:  Oral  Oral  SpO2: 96% 97% 99% 98%  PainSc:        Isolation Precautions No active isolations  Medications Medications  enoxaparin (LOVENOX) injection 40 mg (40 mg Subcutaneous Patient Refused/Not Given 03/23/23 0958)  acetaminophen (TYLENOL) tablet 650 mg (has no administration in time range)    Or  acetaminophen (TYLENOL) suppository 650 mg (has no administration in time range)  ondansetron (ZOFRAN) tablet 4 mg (has no administration in time range)    Or  ondansetron (ZOFRAN) injection 4 mg (has no administration in time range)  rosuvastatin (CRESTOR) tablet 20 mg (20 mg Oral Given 03/23/23 0950)  levothyroxine (SYNTHROID) tablet 50 mcg (50 mcg Oral Given 03/23/23 0728)  gadobutrol (GADAVIST) 1 MMOL/ML injection 7 mL (7 mLs Intravenous Contrast Given 03/22/23 1925)  alum & mag hydroxide-simeth (MAALOX/MYLANTA) 200-200-20 MG/5ML suspension 30 mL (30 mLs Oral Given 03/22/23 2052)    And  lidocaine (XYLOCAINE) 2 % viscous mouth solution 15 mL (15 mLs Oral Given 03/22/23 2052)    Mobility Walks, but very unsteady on feet currently.

## 2023-03-23 NOTE — Progress Notes (Signed)
Pt not understanding the need for safety precautions, confused, attempting to get up unassisted without calling for help. Placed in lap belt as ordered and safety sitter at bedside

## 2023-03-23 NOTE — ED Notes (Signed)
Patient getting out bed again, stating "I need to put my clothes on because my wife is going to be here to take me home." Redirected patient and informed him of his stay in the hospital. Pt willingly got back in bed with redirection.

## 2023-03-23 NOTE — Progress Notes (Signed)
PROGRESS NOTE    Donald Robbins  UEA:[id number] DOB: [birth year] DOA: 03/22/2023 PCP: Burton Apley, MD   Brief Narrative: Donald Robbins is a 82 y.o. male with a history of seizure disorder, hyperlipidemia, hypothyroidism.  Patient presented secondary to falls and weakness and found to have a supratherapeutic phenytoin level indicating phenytoin toxicity. Supportive care.   Assessment/Plan:  Phenytoin toxicity Phenytoin level of 35.3 on admission with increase to 37.3 today. Related to home prescription of phenytoin, but unclear why he has toxicity. -Hold phenytoin -Mobility precautions; patient needs assistance out of bed  Ataxia Patient with multiple falls as an outpatient. Balance instability related to phenytoin toxicity. PT/OT consulted and are currently recommending SNF. Unsure if patient will be agreeable to this, however. -TOC consult for SNF -Continued management of phenytoin toxicity  Seizure disorder -Hold phenytoin -Will discuss with neurology about regimen prior to discharge  Hypothyroidism -Continue Synthroid  Hyperlipidemia -Continue Crestor  DVT prophylaxis: Lovenox Code Status:   Code Status: Full Code Family Communication: Wife on telephone Disposition Plan: Discharge home vs SNF likely in 2-3 days pending improvement of symptoms and improvement of phenytoin level   Consultants:  None  Procedures:  None  Antimicrobials: None    Subjective: Patient reports not having issues with walking. He reports not having taken phenytoin for the past two days.  Objective: BP (!) 144/75 (BP Location: Right Arm)   Pulse 68   Temp 97.6 F (36.4 C) (Oral)   Resp 18   SpO2 98%   Examination:  General exam: Appears slightly agitated and comfortable Respiratory system: Clear to auscultation. Respiratory effort normal. Cardiovascular system: S1 & S2 heard, RRR. Gastrointestinal system: Abdomen is nondistended, soft and nontender. Normal  bowel sounds heard. Central nervous system: Alert and oriented. Unsteady with standing. Psychiatry: Judgement as it relates to ability to mobilize appears impaired.   Data Reviewed: I have personally reviewed following labs and imaging studies   Last CBC Lab Results  Component Value Date   WBC 9.1 03/22/2023   HGB 13.7 03/22/2023   HCT 39.5 03/22/2023   MCV 100.8 (H) 03/22/2023   MCH 34.9 (H) 03/22/2023   RDW 13.2 03/22/2023   PLT 221 03/22/2023     Last metabolic panel Lab Results  Component Value Date   GLUCOSE 151 (H) 03/22/2023   NA 132 (L) 03/22/2023   K 4.0 03/22/2023   CL 99 03/22/2023   CO2 23 03/22/2023   BUN 13 03/22/2023   CREATININE 0.87 03/22/2023   GFRNONAA >60 03/22/2023   CALCIUM 8.9 03/22/2023   ANIONGAP 10 03/22/2023     Creatinine Clearance: CrCl cannot be calculated (Unknown ideal weight.).  No results found for this or any previous visit (from the past 240 hour(s)).    Radiology Studies: MR BRAIN W WO CONTRAST  Result Date: 03/22/2023 CLINICAL DATA:  Ataxia, head trauma worsening dizziness and falls, unremarkable ct head, rule out cva. EXAM: MRI HEAD WITHOUT AND WITH CONTRAST TECHNIQUE: Multiplanar, multiecho pulse sequences of the brain and surrounding structures were obtained without and with intravenous contrast. CONTRAST:  7mL GADAVIST GADOBUTROL 1 MMOL/ML IV SOLN COMPARISON:  Head CT 03/22/2023. FINDINGS: Brain: No acute infarct or hemorrhage. No hydrocephalus or extra-axial collection. No foci of abnormal susceptibility. No mass or abnormal enhancement. Vascular: Normal flow voids and vessel enhancement. Skull and upper cervical spine: Normal marrow signal and enhancement. Sinuses/Orbits: No acute findings. Other: None. IMPRESSION: Normal brain MRI. Electronically Signed   By: Elwyn Reach.D.  On: 03/22/2023 19:43   CT Head Wo Contrast  Result Date: 03/22/2023 CLINICAL DATA:  Head trauma, moderate-severe EXAM: CT HEAD WITHOUT CONTRAST  TECHNIQUE: Contiguous axial images were obtained from the base of the skull through the vertex without intravenous contrast. RADIATION DOSE REDUCTION: This exam was performed according to the departmental dose-optimization program which includes automated exposure control, adjustment of the mA and/or kV according to patient size and/or use of iterative reconstruction technique. COMPARISON:  None Available. FINDINGS: Brain: No hemorrhage. No hydrocephalus. No extra-axial fluid collection. No CT evidence of an acute cortical infarct. No mass effect. No mass lesion. Vascular: No hyperdense vessel or unexpected calcification. Skull: Normal. Negative for fracture or focal lesion. Sinuses/Orbits: No middle ear or mastoid effusion. Paranasal sinuses are clear. Orbits are unremarkable. Other: None. IMPRESSION: No CT evidence of intracranial injury Electronically Signed   By: Lorenza Cambridge M.D.   On: 03/22/2023 16:08      LOS: 0 days    Jacquelin Hawking, MD [facility] 03/23/2023, 10:52 AM   If 7PM-7AM, please contact night-coverage [email or web address]

## 2023-03-23 NOTE — Evaluation (Signed)
Occupational Therapy Evaluation Patient Details Name: Donald Robbins MRN: [id number] DOB: Mar 03, 1941 Today's Date: 03/23/2023   History of Present Illness Patient is a 82 year old male who presented to the ED with gait instability and falls. Patient noted to have had 8 falls prior to arrival. Patient was admitted with phenytoin toxicity. PMH; seizure disorder.   Clinical Impression   Patient is a 82 year old male who was admitted for above. Patient was living at home with wife prior level with patient in bed in ED with no family present at this time. Patient was easily distracted during session asking for wife multiple times during session with no awareness that question was just answered. Patient with strong posterior leaning in standing with decreased awareness or ability to self correct balance. Patient would need 24/7 caregiver physical A assist to be successful in the next level of care. Patient will benefit from continued inpatient follow up therapy, <3 hours/day       If plan is discharge home, recommend the following: Two people to help with walking and/or transfers;Assistance with cooking/housework;A lot of help with bathing/dressing/bathroom;Direct supervision/assist for medications management;Assist for transportation;Help with stairs or ramp for entrance;Direct supervision/assist for financial management    Functional Status Assessment  Patient has had a recent decline in their functional status and demonstrates the ability to make significant improvements in function in a reasonable and predictable amount of time.  Equipment Recommendations  None recommended by OT       Precautions / Restrictions Precautions Precautions: Fall Restrictions Weight Bearing Restrictions: No      Mobility Bed Mobility Overal bed mobility: Needs Assistance Bed Mobility: Sit to Supine       Sit to supine: Min assist   General bed mobility comments: to get BLE back into bed.           Balance Overall balance assessment: Needs assistance Sitting-balance support: Feet supported, No upper extremity supported Sitting balance-Leahy Scale: Fair     Standing balance support: Bilateral upper extremity supported, Reliant on assistive device for balance Standing balance-Leahy Scale: Poor Standing balance comment: posterior leaning             ADL either performed or assessed with clinical judgement   ADL Overall ADL's : Needs assistance/impaired Eating/Feeding: Modified independent;Sitting Eating/Feeding Details (indicate cue type and reason): taking sips of water. Grooming: Supervision/safety;Contact guard assist;Sitting   Upper Body Bathing: Set up;Sitting   Lower Body Bathing: Maximal assistance;Sitting/lateral leans;Sit to/from stand   Upper Body Dressing : Set up;Sitting   Lower Body Dressing: Maximal assistance;Sitting/lateral leans;Sit to/from stand Lower Body Dressing Details (indicate cue type and reason): unable to take hands off walker in standing with posterior leaning. patient was educated on keeping toes on floor but unable to sustain positioning. appears to have little awareness of need for A to maintain standing balance. Toilet Transfer: Maximal assistance;Ambulation;Rolling walker (2 wheels) Toilet Transfer Details (indicate cue type and reason): patient declined to progress to commode at this time standing EOB for about two mins with varying level of assist to maintain balance from min a to mod A with posterior leaning and lifting toes fully off the floor. Toileting- Architect and Hygiene: Sit to/from stand;Total assistance               Vision   Vision Assessment?: No apparent visual deficits         Extremity/Trunk Assessment Upper Extremity Assessment Upper Extremity Assessment: Overall WFL for tasks assessed   Lower  Extremity Assessment Lower Extremity Assessment: Defer to PT evaluation   Cervical / Trunk  Assessment Cervical / Trunk Assessment: Kyphotic   Communication     Cognition Arousal: Alert Behavior During Therapy: Restless, Impulsive Overall Cognitive Status: No family/caregiver present to determine baseline cognitive functioning         General Comments: patient fixated on wife not being present during session. patient repeated questions multiple times with appearing to have little memory of having been provided answers prior. 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                Home Living Family/patient expects to be discharged to:: Private residence Living Arrangements: Spouse/significant other Available Help at Discharge: Family;Available 24 hours/day         Additional Comments: unable to get further information from patient with fixation on desire to call wife and for her to be in ED with him.      Prior Functioning/Environment Prior Level of Function : Independent/Modified Independent;History of Falls (last six months)             Mobility Comments: chart noted patient to have had over 8 falls in last month          OT Problem List: Decreased activity tolerance;Decreased range of motion;Impaired balance (sitting and/or standing);Decreased coordination;Decreased safety awareness;Decreased cognition;Decreased knowledge of use of DME or AE;Decreased knowledge of precautions      OT Treatment/Interventions: Self-care/ADL training;Therapeutic exercise;DME and/or AE instruction;Therapeutic activities;Patient/family education;Balance training    OT Goals(Current goals can be found in the care plan section) Acute Rehab OT Goals Patient Stated Goal: to get wife here OT Goal Formulation: Patient unable to participate in goal setting Time For Goal Achievement: 04/06/23 Potential to Achieve Goals: Fair  OT Frequency: Min 1X/week       AM-PAC OT "6 Clicks" Daily Activity     Outcome Measure Help  from another person eating meals?: A Little Help from another person taking care of personal grooming?: A Little Help from another person toileting, which includes using toliet, bedpan, or urinal?: A Lot Help from another person bathing (including washing, rinsing, drying)?: A Lot Help from another person to put on and taking off regular upper body clothing?: A Little Help from another person to put on and taking off regular lower body clothing?: A Lot 6 Click Score: 15   End of Session Equipment Utilized During Treatment: Gait belt;Rolling walker (2 wheels) Nurse Communication: Mobility status  Activity Tolerance: Treatment limited secondary to agitation Patient left: in bed;with call bell/phone within reach (in hallway in ED with nurse watching)  OT Visit Diagnosis: Unsteadiness on feet (R26.81);Other abnormalities of gait and mobility (R26.89);Muscle weakness (generalized) (M62.81);Repeated falls (R29.6);History of falling (Z91.81)                Time: 1610-9604 OT Time Calculation (min): 9 min Charges:  OT General Charges $OT Visit: 1 Visit OT Evaluation $OT Eval Low Complexity: 1 Low  Ousmane Seeman OTR/L, MS Acute Rehabilitation Department Office# [phone]   Selinda Flavin 03/23/2023, 9:53 AM

## 2023-03-23 NOTE — ED Notes (Signed)
Patient's bed alarm was going off, found out of the bed by staff members, very unsteady. Able to get patient safely back in bed without any falls or injuries. Moved patient to Troy D, so patient could be watched closely.

## 2023-03-23 NOTE — Evaluation (Addendum)
Physical Therapy Evaluation Patient Details Name: Donald Robbins MRN: [id number] DOB: 1941-02-04 Today's Date: 03/23/2023  History of Present Illness  Patient is a 82 year old male who presented to the ED with gait instability and falls. Patient noted to have had 8 falls prior to arrival. Patient was admitted with phenytoin toxicity. PMH; seizure disorder.  Clinical Impression  Pt admitted with above diagnosis.  Pt currently with functional limitations due to the deficits listed below (see PT Problem List). Pt will benefit from acute skilled PT to increase their independence and safety with mobility to allow discharge.   The patient is impulsive and requires multimodal cues  for safety. Patient ambulated with RW and frequent cues  for safety, patient  focused on  being moved to the hall and concerned wife will not  find patient.  Per report , patient has  had multiple  falls.  Patient will benefit from continued inpatient follow up therapy, <3 hours/day          If plan is discharge home, recommend the following: A little help with walking and/or transfers;A little help with bathing/dressing/bathroom;Assistance with cooking/housework;Assist for transportation;Help with stairs or ramp for entrance;Supervision due to cognitive status   Can travel by private vehicle    no    Equipment Recommendations  none  Recommendations for Other Services       Functional Status Assessment Patient has had a recent decline in their functional status and demonstrates the ability to make significant improvements in function in a reasonable and predictable amount of time.     Precautions / Restrictions Precautions Precautions: Fall Restrictions Weight Bearing Restrictions: No      Mobility  Bed Mobility Overal bed mobility: Needs Assistance Bed Mobility: Supine to Sit     Supine to sit: Supervision Sit to supine: Min assist   General bed mobility comments: multimodal cues  to get BLE  back into bed.    Transfers   Equipment used: Agricultural consultant (2 wheels)                    Ambulation/Gait                  Stairs            Wheelchair Mobility     Tilt Bed    Modified Rankin (Stroke Patients Only)       Balance Overall balance assessment: Needs assistance, History of Falls Sitting-balance support: Feet supported, No upper extremity supported Sitting balance-Leahy Scale: Fair     Standing balance support: Bilateral upper extremity supported, Reliant on assistive device for balance Standing balance-Leahy Scale: Poor Standing balance comment: posterior leaning, at times lets go of Rw or turns fast                             Pertinent Vitals/Pain Pain Assessment Pain Assessment: No/denies pain    Home Living Family/patient expects to be discharged to:: Private residence Living Arrangements: Spouse/significant other Available Help at Discharge: Family;Available 24 hours/day Type of Home: House             Additional Comments: unable to get further information from patient with fixation on desire to call wife and for her to be in ED with him.    Prior Function Prior Level of Function : Needs assist             Mobility Comments: chart noted patient to  have had over 8 falls in last month ADLs Comments: unsure     Extremity/Trunk Assessment        Lower Extremity Assessment Lower Extremity Assessment: LLE deficits/detail;Generalized weakness LLE Deficits / Details: noted upgoing toes, tenda to invert foot       Communication   Communication Communication: No apparent difficulties  Cognition Arousal: Alert Behavior During Therapy: Restless, Impulsive Overall Cognitive Status: No family/caregiver present to determine baseline cognitive functioning                                 General Comments: patient fixated on being moved from his room 13 and  into hall. Unable to  explain,  also fixated that wife may not find him. Patient repeated questions multiple times with appearing to have little memory of having been provided answers prior. 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        General Comments      Exercises     Assessment/Plan    PT Assessment Patient needs continued PT services  PT Problem List Decreased activity tolerance;Decreased mobility;Decreased cognition;Decreased safety awareness;Decreased balance;Decreased knowledge of precautions       PT Treatment Interventions DME instruction;Therapeutic activities;Balance training;Cognitive remediation;Gait training;Functional mobility training;Patient/family education    PT Goals (Current goals can be found in the Care Plan section)  Acute Rehab PT Goals Patient Stated Goal: to go back to room 13 PT Goal Formulation: With patient Time For Goal Achievement: 04/06/23 Potential to Achieve Goals: Fair    Frequency Min 1X/week     Co-evaluation               AM-PAC PT "6 Clicks" Mobility  Outcome Measure Help needed turning from your back to your side while in a flat bed without using bedrails?: A Little Help needed moving from lying on your back to sitting on the side of a flat bed without using bedrails?: A Little Help needed moving to and from a bed to a chair (including a wheelchair)?: A Little Help needed standing up from a chair using your arms (e.g., wheelchair or bedside chair)?: A Little Help needed to walk in hospital room?: A Little Help needed climbing 3-5 steps with a railing? : A Lot 6 Click Score: 17    End of Session Equipment Utilized During Treatment: Gait belt Activity Tolerance: Patient tolerated treatment well Patient left: in bed;with call bell/phone within reach (with OT) Nurse Communication: Mobility status PT Visit Diagnosis: Unsteadiness on feet (R26.81)    Time: 4098-1191 PT Time Calculation (min)  (ACUTE ONLY): 15 min   Charges:   PT Evaluation $PT Eval Low Complexity: 1 Low   PT General Charges $$ ACUTE PT VISIT: 1 Visit        Blanchard Kelch PT Acute Rehabilitation Services Office [phone] Weekend [phone]   Rada Hay 03/23/2023, 1:55 PM

## 2023-03-24 DIAGNOSIS — T420X1A Poisoning by hydantoin derivatives, accidental (unintentional), initial encounter: Secondary | ICD-10-CM | POA: Diagnosis not present

## 2023-03-24 LAB — ALBUMIN: Albumin: 4 g/dL (ref 3.5–5.0)

## 2023-03-24 LAB — PHENYTOIN LEVEL, TOTAL: Phenytoin Lvl: 30.4 ug/mL (ref 10.0–20.0)

## 2023-03-24 MED ORDER — LEVETIRACETAM 500 MG PO TABS
500.0000 mg | ORAL_TABLET | Freq: Two times a day (BID) | ORAL | Status: DC
  Administered 2023-03-24 – 2023-03-25 (×3): 500 mg via ORAL
  Filled 2023-03-24 (×3): qty 1

## 2023-03-24 NOTE — Progress Notes (Signed)
PROGRESS NOTE    Donald Robbins  MWU:[id number] DOB: Feb 11, 1941 DOA: 03/22/2023 PCP: Burton Apley, MD   Brief Narrative: Donald Robbins is a 82 y.o. male with a history of seizure disorder, hyperlipidemia, hypothyroidism.  Patient presented secondary to falls and weakness and found to have a supratherapeutic phenytoin level indicating phenytoin toxicity. Supportive care.   Assessment/Plan:  Phenytoin toxicity Phenytoin level of 35.3 on admission with increase to 37.3. Related to home prescription of phenytoin, but unclear why he has toxicity. Phenytoin level down to 30.4 with an albumin of 4. PT/OT recommending SNF -Discontinue phenytoin on discharge -PT/OT  Ataxia Patient with multiple falls as an outpatient. Balance instability related to phenytoin toxicity. PT/OT consulted and are currently recommending SNF. Unsure if patient will be agreeable to this, however. -TOC consult for SNF -Continued management of phenytoin toxicity  Seizure disorder -Discontinue phenytoin -Discussed with Dr. Wilford Corner with recommendation to discontinue phenytoin and start Keppra 500 mg BID -Referral to outpatient neurology (placed on 10/15)  Hypothyroidism -Continue Synthroid  Hyperlipidemia -Continue Crestor  DVT prophylaxis: Lovenox Code Status:   Code Status: Full Code Family Communication: Wife and daughter at bedside. Granddaughter on telephone. Disposition Plan: Discharge home vs SNF likely in 2-3 days pending improvement of symptoms and improvement of phenytoin level down to around 20 or below   Consultants:  None  Procedures:  None  Antimicrobials: None    Subjective: Patient reports not having issues with walking. He reports not having taken phenytoin for the past two days.  Objective: BP 137/78 (BP Location: Right Arm)   Pulse 73   Temp 98.1 F (36.7 C) (Oral)   Resp 18   Ht 6\' 3"  (1.905 m)   Wt 69.3 kg   SpO2 98%   BMI 19.10 kg/m    Examination:  General exam: Appears calm and comfortable Respiratory system: Clear to auscultation. Respiratory effort normal. Cardiovascular system: S1 & S2 heard, RRR. No murmurs Gastrointestinal system: Abdomen is nondistended, soft and nontender. Normal bowel sounds heard. Central nervous system: Alert and oriented.  Psychiatry: Judgement and insight appear normal. Mood & affect appropriate.    Data Reviewed: I have personally reviewed following labs and imaging studies   Last CBC Lab Results  Component Value Date   WBC 9.1 03/22/2023   HGB 13.7 03/22/2023   HCT 39.5 03/22/2023   MCV 100.8 (H) 03/22/2023   MCH 34.9 (H) 03/22/2023   RDW 13.2 03/22/2023   PLT 221 03/22/2023     Last metabolic panel Lab Results  Component Value Date   GLUCOSE 151 (H) 03/22/2023   NA 132 (L) 03/22/2023   K 4.0 03/22/2023   CL 99 03/22/2023   CO2 23 03/22/2023   BUN 13 03/22/2023   CREATININE 0.87 03/22/2023   GFRNONAA >60 03/22/2023   CALCIUM 8.9 03/22/2023   ANIONGAP 10 03/22/2023     Creatinine Clearance: Estimated Creatinine Clearance: 64.2 mL/min (by C-G formula based on SCr of 0.87 mg/dL).  No results found for this or any previous visit (from the past 240 hour(s)).    Radiology Studies: MR BRAIN W WO CONTRAST  Result Date: 03/22/2023 CLINICAL DATA:  Ataxia, head trauma worsening dizziness and falls, unremarkable ct head, rule out cva. EXAM: MRI HEAD WITHOUT AND WITH CONTRAST TECHNIQUE: Multiplanar, multiecho pulse sequences of the brain and surrounding structures were obtained without and with intravenous contrast. CONTRAST:  7mL GADAVIST GADOBUTROL 1 MMOL/ML IV SOLN COMPARISON:  Head CT 03/22/2023. FINDINGS: Brain: No acute infarct or  hemorrhage. No hydrocephalus or extra-axial collection. No foci of abnormal susceptibility. No mass or abnormal enhancement. Vascular: Normal flow voids and vessel enhancement. Skull and upper cervical spine: Normal marrow signal and  enhancement. Sinuses/Orbits: No acute findings. Other: None. IMPRESSION: Normal brain MRI. Electronically Signed   By: Orvan Falconer M.D.   On: 03/22/2023 19:43   CT Head Wo Contrast  Result Date: 03/22/2023 CLINICAL DATA:  Head trauma, moderate-severe EXAM: CT HEAD WITHOUT CONTRAST TECHNIQUE: Contiguous axial images were obtained from the base of the skull through the vertex without intravenous contrast. RADIATION DOSE REDUCTION: This exam was performed according to the departmental dose-optimization program which includes automated exposure control, adjustment of the mA and/or kV according to patient size and/or use of iterative reconstruction technique. COMPARISON:  None Available. FINDINGS: Brain: No hemorrhage. No hydrocephalus. No extra-axial fluid collection. No CT evidence of an acute cortical infarct. No mass effect. No mass lesion. Vascular: No hyperdense vessel or unexpected calcification. Skull: Normal. Negative for fracture or focal lesion. Sinuses/Orbits: No middle ear or mastoid effusion. Paranasal sinuses are clear. Orbits are unremarkable. Other: None. IMPRESSION: No CT evidence of intracranial injury Electronically Signed   By: Lorenza Cambridge M.D.   On: 03/22/2023 16:08      LOS: 1 day    Jacquelin Hawking, MD [facility] 03/24/2023, 7:52 AM   If 7PM-7AM, please contact night-coverage [email or web address]

## 2023-03-24 NOTE — Progress Notes (Signed)
Pt attempting to get out of bed repeatedly, setting off the bed alarm and not following directions and pulling at his IV. Lap belt attempted to negative effect. Mittens attempted next, pt removed them and continued pulling at IV site. Soft wrist restraints applied to stop patient from removing IV and/lap belt. Patient toilet every 2 hours, offered fluids and range of motion perfromed.

## 2023-03-24 NOTE — Plan of Care (Signed)

## 2023-03-24 NOTE — Progress Notes (Signed)
Spoke with the daughter Gloris Manchester, explained the situation and requested that the family come to the hospital to assist with the patient's safety. Synetta Fail voiced understanding and stated someone would be down as soon as possible.

## 2023-03-25 DIAGNOSIS — E038 Other specified hypothyroidism: Secondary | ICD-10-CM

## 2023-03-25 DIAGNOSIS — G40909 Epilepsy, unspecified, not intractable, without status epilepticus: Secondary | ICD-10-CM | POA: Diagnosis not present

## 2023-03-25 DIAGNOSIS — T420X1A Poisoning by hydantoin derivatives, accidental (unintentional), initial encounter: Secondary | ICD-10-CM | POA: Diagnosis not present

## 2023-03-25 LAB — PHENYTOIN LEVEL, TOTAL: Phenytoin Lvl: 21.3 ug/mL — ABNORMAL HIGH (ref 10.0–20.0)

## 2023-03-25 LAB — ALBUMIN: Albumin: 3.8 g/dL (ref 3.5–5.0)

## 2023-03-25 MED ORDER — LEVETIRACETAM 500 MG PO TABS: 500.0000 mg | ORAL_TABLET | Freq: Two times a day (BID) | ORAL | 2 refills | Status: DC

## 2023-03-25 NOTE — Hospital Course (Addendum)
82 year old male with past medical history of seizure disorder on phenytoin, hyperlipidemia, hypothyroidism who presented to [facility] emergency department with unsteady gait and frequent falls for several days.  Upon evaluation in the emergency department patient was found to have a markedly elevated phenytoin level of 35.3 concerning for phenytoin toxicity.  Patient was admitted to hospital under the hospitalist service.  Case was discussed with Dr. Wilford Corner with neurology who recommended discontinuation of phenytoin and instead initiation of Keppra 500 mg twice daily.  Patient's home regimen of gabapentin was additionally discontinued.   During the hospitalization patient was evaluated by physical therapy and was recommend the patient would benefit from home health physical therapy.    Upon thorough discussion with the patient and family family is brought about concerned about patient's progressive cognitive decline in the past several months and therefore was advised that the patient follow-up closely with [facility] as an outpatient for evaluation of this as well as his ongoing management of his history of seizure disorder.  Patient was discharged home in improved and stable condition on 10/16.

## 2023-03-25 NOTE — Progress Notes (Signed)
Physical Therapy Treatment Patient Details Name: Donald Robbins MRN: [id number] DOB: 18-Jan-1941 Today's Date: 03/25/2023   History of Present Illness Patient is a 82 year old male who presented to the ED with gait instability and falls. Patient noted to have had 8 falls prior to arrival. Patient was admitted with phenytoin toxicity. PMH; seizure disorder.    PT Comments  Pt is progressing well with mobility, she ambulated 50' with RW with ongoing cues for safe positioning and posture in the RW, no loss of balance nor unsteadiness noted. Pt is oriented to self and location, not to month/year nor current president, but is pleasant and can follow commands with verbal cues. He is mobilizing well enough to DC home now.      If plan is discharge home, recommend the following: A little help with bathing/dressing/bathroom;Assistance with cooking/housework;Assist for transportation   Can travel by private vehicle        Equipment Recommendations  None recommended by PT    Recommendations for Other Services       Precautions / Restrictions Precautions Precautions: Fall Precaution Comments: multiple falls PTA (wife reports these are likely due to phenytoin toxicity) Restrictions Weight Bearing Restrictions: No     Mobility  Bed Mobility Overal bed mobility: Modified Independent Bed Mobility: Supine to Sit     Supine to sit: Modified independent (Device/Increase time), HOB elevated, Used rails          Transfers Overall transfer level: Needs assistance Equipment used: Rolling walker (2 wheels) Transfers: Sit to/from Stand Sit to Stand: Supervision, From elevated surface           General transfer comment: VCs for hand placement    Ambulation/Gait Ambulation/Gait assistance: Contact guard assist Gait Distance (Feet): 260 Feet Assistive device: Rolling walker (2 wheels) Gait Pattern/deviations: Step-to pattern, Decreased stride length, Trunk flexed Gait velocity:  WFL     General Gait Details: ongoing VCs for posture and positioning in RW   Stairs             Wheelchair Mobility     Tilt Bed    Modified Rankin (Stroke Patients Only)       Balance Overall balance assessment: Needs assistance, History of Falls Sitting-balance support: Feet supported, No upper extremity supported Sitting balance-Leahy Scale: Good     Standing balance support: Bilateral upper extremity supported, Reliant on assistive device for balance Standing balance-Leahy Scale: Fair Standing balance comment: posterior leaning, at times lets go of Rw or turns fast                            Cognition Arousal: Alert Behavior During Therapy: WFL for tasks assessed/performed Overall Cognitive Status: Impaired/Different from baseline Area of Impairment: Orientation                               General Comments: oriented to self and location, not to month/year, not able to state name of current president        Exercises      General Comments        Pertinent Vitals/Pain Pain Assessment Pain Score: 0-No pain Faces Pain Scale: No hurt    Home Living                          Prior Function  PT Goals (current goals can now be found in the care plan section) Acute Rehab PT Goals Patient Stated Goal: to go home PT Goal Formulation: With patient/family Time For Goal Achievement: 04/06/23 Potential to Achieve Goals: Good Progress towards PT goals: Progressing toward goals    Frequency    Min 1X/week      PT Plan      Co-evaluation              AM-PAC PT "6 Clicks" Mobility   Outcome Measure  Help needed turning from your back to your side while in a flat bed without using bedrails?: None Help needed moving from lying on your back to sitting on the side of a flat bed without using bedrails?: A Little Help needed moving to and from a bed to a chair (including a wheelchair)?: None Help  needed standing up from a chair using your arms (e.g., wheelchair or bedside chair)?: None Help needed to walk in hospital room?: A Little Help needed climbing 3-5 steps with a railing? : A Little 6 Click Score: 21    End of Session Equipment Utilized During Treatment: Gait belt Activity Tolerance: Patient tolerated treatment well Patient left: in chair;with call bell/phone within reach;with family/visitor present;with chair alarm set Nurse Communication: Mobility status PT Visit Diagnosis: Unsteadiness on feet (R26.81)     Time: 4098-1191 PT Time Calculation (min) (ACUTE ONLY): 19 min  Charges:    $Gait Training: 8-22 mins PT General Charges $$ ACUTE PT VISIT: 1 Visit                     Tamala Ser PT 03/25/2023  Acute Rehabilitation Services  Office [phone]

## 2023-03-25 NOTE — TOC Initial Note (Signed)
Transition of Care [facility]) - Initial/Assessment Note    Patient Details  Name: Donald Robbins MRN: [id number] Date of Birth: 1940-07-22  [facility]) CM/SW Contact:    Beckie Busing, RN Phone [phone]  03/25/2023, 10:48 AM  Clinical Narrative:                 [facility] acknowledges consult for SNF placement. CM at bedside with patient and spouse. Spouse states that patient is from home where he is generally functions independently with some assist. Spouse states that patient is currently weak and will definitely need some therapy prior to coming home. CM provided spouse with medicare.gov list for choice. CM to initiate SNF workup.   Expected Discharge Plan: Skilled Nursing Facility Barriers to Discharge: Continued Medical Work up   Patient Goals and CMS Choice Patient states their goals for this hospitalization and ongoing recovery are:: Wants to get stronger in order to go home CMS Medicare.gov Compare Post Acute Care list provided to:: Patient Choice offered to / list presented to : Patient, Spouse [place] ownership interest in [facility].provided to:: Spouse    Expected Discharge Plan and Services In-house Referral: NA Discharge Planning Services: CM Consult Post Acute Care Choice: Skilled Nursing Facility Living arrangements for the past 2 months: Single Family Home                 DME Arranged: N/A DME Agency: NA       HH Arranged: NA HH Agency: NA        Prior Living Arrangements/Services Living arrangements for the past 2 months: Single Family Home Lives with:: Spouse Patient language and need for interpreter reviewed:: Yes Do you feel safe going back to the place where you live?: Yes      Need for Family Participation in Patient Care: Yes (Comment) Care giver support system in place?: Yes (comment) Current home services: Other (comment) Criminal Activity/Legal Involvement Pertinent to Current Situation/Hospitalization: No -  Comment as needed  Activities of Daily Living   ADL Screening (condition at time of admission) Independently performs ADLs?: No Does the patient have a NEW difficulty with bathing/dressing/toileting/self-feeding that is expected to last >3 days?: Yes (Initiates electronic notice to provider for possible OT consult) Does the patient have a NEW difficulty with getting in/out of bed, walking, or climbing stairs that is expected to last >3 days?: Yes (Initiates electronic notice to provider for possible PT consult) Does the patient have a NEW difficulty with communication that is expected to last >3 days?: No Is the patient deaf or have difficulty hearing?: Yes (very HOH) Does the patient have difficulty seeing, even when wearing glasses/contacts?: No Does the patient have difficulty concentrating, remembering, or making decisions?: Yes  Permission Sought/Granted Permission sought to share information with : Family Supports Permission granted to share information with : Yes, Verbal Permission Granted  Share Information with NAME: Roberta Kelly     Permission granted to share info w Relationship: spouse  Permission granted to share info w Contact Information: [phone]  Emotional Assessment Appearance:: Appears stated age Attitude/Demeanor/Rapport: Unable to Assess (quiet patient did not talk wife did all the talking) Affect (typically observed): Unable to Assess Orientation: : Oriented to Self Alcohol / Substance Use: Not Applicable Psych Involvement: No (comment)  Admission diagnosis:  Fall, initial encounter [W19.XXXA] Phenytoin toxicity, accidental or unintentional, initial encounter [T42.0X1A] Phenytoin toxicity [T42.0X1A] Patient Active Problem List   Diagnosis Date Noted   Phenytoin toxicity 03/23/2023  Phenytoin toxicity, accidental or unintentional, initial encounter 03/22/2023   Hypothyroidism 03/22/2023   Seizure disorder (HCC) 03/22/2023   PCP:  Burton Apley,  MD Pharmacy:   [id number] - , [place] - [address] [place] [address] Phone: [phone] Fax: [phone]     Social Determinants of Health (SDOH) Social History: SDOH Screenings   Food Insecurity: No Food Insecurity (03/23/2023)  Housing: Low Risk  (03/23/2023)  Transportation Needs: No Transportation Needs (03/23/2023)  Utilities: Not At Risk (03/23/2023)  Social Connections: Unknown (10/21/2021)   Received from [facility]  Tobacco Use: Medium Risk (03/22/2023)   SDOH Interventions:     Readmission Risk Interventions     No data to display

## 2023-03-25 NOTE — Discharge Summary (Signed)
Physician Discharge Summary   Patient: Donald Robbins MRN: [id number] DOB: [birth year]  Admit date:     03/22/2023  Discharge date: 03/25/23  Discharge Physician: Marinda Elk   PCP: Burton Apley, MD   Recommendations at discharge:   Patient has been instructed to follow-up as an outpatient with [facility] upon first available appointment for outpatient evaluation of his history of seizure disorder as well as concerns for progressive cognitive decline. Patient is additionally been advised to follow-up with a new primary care provider at [facility] per their request Patient has been advised to discontinue gabapentin and phenytoin Patient has been advised to instead start Keppra 500 mg twice daily Patient may ambulate with assistance using an assistive device Home health physical therapy referral has been placed Patient is full code Patient is to return to the emergency department if he develops worsening weakness confusion or worsening oral intake.  Discharge Diagnoses: Principal Problem:   Phenytoin toxicity, accidental or unintentional, initial encounter Active Problems:   Hypothyroidism   Seizure disorder (HCC)   Phenytoin toxicity  Resolved Problems:   * No resolved hospital problems. *   Hospital Course: 82 year old male with past medical history of seizure disorder on phenytoin, hyperlipidemia, hypothyroidism who presented to [facility] emergency department with unsteady gait and frequent falls for several days.  Upon evaluation in the emergency department patient was found to have a markedly elevated phenytoin level of 35.3 concerning for phenytoin toxicity.  Patient was admitted to hospital under the hospitalist service.  Case was discussed with Dr. Wilford Corner with neurology who recommended discontinuation of phenytoin and instead initiation of Keppra 500 mg twice daily.  Patient's home regimen of  gabapentin was additionally discontinued.   During the hospitalization patient was evaluated by physical therapy and was recommend the patient would benefit from home health physical therapy.    Upon thorough discussion with the patient and family family is brought about concerned about patient's progressive cognitive decline in the past several months and therefore was advised that the patient follow-up closely with [facility] as an outpatient for evaluation of this as well as his ongoing management of his history of seizure disorder.  Patient was discharged home in improved and stable condition on 10/16.    Consultants: none Procedures performed: none  Disposition: Home health Diet recommendation:  Discharge Diet Orders (From admission, onward)     Start     Ordered   03/25/23 0000  Diet - low sodium heart healthy        03/25/23 1310           Cardiac diet  DISCHARGE MEDICATION: Allergies as of 03/25/2023   No Known Allergies      Medication List     TAKE these medications    levETIRAcetam 500 MG tablet Commonly known as: KEPPRA Take 1 tablet (500 mg total) by mouth 2 (two) times daily.   levothyroxine 50 MCG tablet Commonly known as: SYNTHROID Take 50 mcg by mouth daily before breakfast.   rosuvastatin 20 MG tablet Commonly known as: CRESTOR Take 20 mg by mouth daily.        Follow-up Information     [facility]. Schedule an appointment as soon as possible for a visit in 2 week(s).   Contact information: [address] [place] [address] [phone]        [facility]. Schedule an appointment as  soon as possible for a visit today.   Contact information: [address]     [address] [place] [address] [phone]                Discharge Exam: Ceasar Mons Weights   03/23/23 1231  Weight: 69.3 kg    Constitutional: Awake  alert and oriented x3, no associated distress.   Respiratory: clear to auscultation bilaterally, no wheezing, no crackles. Normal respiratory effort. No accessory muscle use.  Cardiovascular: Regular rate and rhythm, no murmurs / rubs / gallops. No extremity edema. 2+ pedal pulses. No carotid bruits.  Abdomen: Abdomen is soft and nontender.  No evidence of intra-abdominal masses.  Positive bowel sounds noted in all quadrants.   Musculoskeletal: No joint deformity upper and lower extremities. Good ROM, no contractures. Normal muscle tone.     Condition at discharge: fair  The results of significant diagnostics from this hospitalization (including imaging, microbiology, ancillary and laboratory) are listed below for reference.   Imaging Studies: MR BRAIN W WO CONTRAST  Result Date: 03/22/2023 CLINICAL DATA:  Ataxia, head trauma worsening dizziness and falls, unremarkable ct head, rule out cva. EXAM: MRI HEAD WITHOUT AND WITH CONTRAST TECHNIQUE: Multiplanar, multiecho pulse sequences of the brain and surrounding structures were obtained without and with intravenous contrast. CONTRAST:  7mL GADAVIST GADOBUTROL 1 MMOL/ML IV SOLN COMPARISON:  Head CT 03/22/2023. FINDINGS: Brain: No acute infarct or hemorrhage. No hydrocephalus or extra-axial collection. No foci of abnormal susceptibility. No mass or abnormal enhancement. Vascular: Normal flow voids and vessel enhancement. Skull and upper cervical spine: Normal marrow signal and enhancement. Sinuses/Orbits: No acute findings. Other: None. IMPRESSION: Normal brain MRI. Electronically Signed   By: Orvan Falconer M.D.   On: 03/22/2023 19:43   CT Head Wo Contrast  Result Date: 03/22/2023 CLINICAL DATA:  Head trauma, moderate-severe EXAM: CT HEAD WITHOUT CONTRAST TECHNIQUE: Contiguous axial images were obtained from the base of the skull through the vertex without intravenous contrast. RADIATION DOSE REDUCTION: This exam was performed according to the  departmental dose-optimization program which includes automated exposure control, adjustment of the mA and/or kV according to patient size and/or use of iterative reconstruction technique. COMPARISON:  None Available. FINDINGS: Brain: No hemorrhage. No hydrocephalus. No extra-axial fluid collection. No CT evidence of an acute cortical infarct. No mass effect. No mass lesion. Vascular: No hyperdense vessel or unexpected calcification. Skull: Normal. Negative for fracture or focal lesion. Sinuses/Orbits: No middle ear or mastoid effusion. Paranasal sinuses are clear. Orbits are unremarkable. Other: None. IMPRESSION: No CT evidence of intracranial injury Electronically Signed   By: Lorenza Cambridge M.D.   On: 03/22/2023 16:08    Microbiology: Results for orders placed or performed in visit on 05/12/19  Novel Coronavirus, NAA ([facility])     Status: Abnormal   Collection Time: 05/12/19 12:00 AM   Specimen: Nasopharyngeal(NP) swabs in vial transport medium   NASOPHARYNGE  TESTING  Result Value Ref Range Status   SARS-CoV-2, NAA Detected (A) Not Detected Final    Comment: This nucleic acid amplification test was developed and its performance characteristics determined by [employer]. Nucleic acid amplification tests include PCR and TMA. This test has not been FDA cleared or approved. This test has been authorized by FDA under an Emergency Use Authorization (EUA). This test is only authorized for the duration of time the declaration that circumstances exist justifying the authorization of the emergency use of in vitro diagnostic tests for detection of SARS-CoV-2 virus and/or diagnosis of  COVID-19 infection under section 564(b)(1) of the Act, 21 U.S.C. 782NFA-2(Z) (1), unless the authorization is terminated or revoked sooner. When diagnostic testing is negative, the possibility of a false negative result should be considered in the context of a patient's recent exposures and the presence of  clinical signs and symptoms consistent with COVID-19. An individual without symptoms of COVID-19 and who is not shedding SARS-CoV-2 virus would  expect to have a negative (not detected) result in this assay.     Labs: CBC: Recent Labs  Lab 03/22/23 1514  WBC 9.1  HGB 13.7  HCT 39.5  MCV 100.8*  PLT 221   Basic Metabolic Panel: Recent Labs  Lab 03/22/23 1514  NA 132*  K 4.0  CL 99  CO2 23  GLUCOSE 151*  BUN 13  CREATININE 0.87  CALCIUM 8.9   Liver Function Tests: Recent Labs  Lab 03/24/23 0825 03/25/23 0544  ALBUMIN 4.0 3.8   CBG: No results for input(s): "GLUCAP" in the last 168 hours.  Discharge time spent: greater than 30 minutes.  Signed: Marinda Elk, MD [facility] 03/25/2023

## 2023-03-25 NOTE — Plan of Care (Addendum)
Patient's wife and daughter at the bedside, all discharge information completed and family educated on new medication Keppra, follow up appointments, and all questions answered. PIV removed, awaiting home health agency update. 1630: Patient left in supervision of family in stable condition.  Problem: Education: Goal: Knowledge of General Education information will improve Description: Including pain rating scale, medication(s)/side effects and non-pharmacologic comfort measures Outcome: Adequate for Discharge   Problem: Health Behavior/Discharge Planning: Goal: Ability to manage health-related needs will improve Outcome: Adequate for Discharge   Problem: Clinical Measurements: Goal: Ability to maintain clinical measurements within normal limits will improve Outcome: Adequate for Discharge Goal: Will remain free from infection Outcome: Adequate for Discharge Goal: Diagnostic test results will improve Outcome: Adequate for Discharge Goal: Respiratory complications will improve Outcome: Adequate for Discharge Goal: Cardiovascular complication will be avoided Outcome: Adequate for Discharge   Problem: Activity: Goal: Risk for activity intolerance will decrease Outcome: Adequate for Discharge   Problem: Nutrition: Goal: Adequate nutrition will be maintained Outcome: Adequate for Discharge   Problem: Coping: Goal: Level of anxiety will decrease Outcome: Adequate for Discharge   Problem: Elimination: Goal: Will not experience complications related to bowel motility Outcome: Adequate for Discharge Goal: Will not experience complications related to urinary retention Outcome: Adequate for Discharge   Problem: Pain Managment: Goal: General experience of comfort will improve Outcome: Adequate for Discharge   Problem: Safety: Goal: Ability to remain free from injury will improve Outcome: Adequate for Discharge   Problem: Skin Integrity: Goal: Risk for impaired skin integrity  will decrease Outcome: Adequate for Discharge   Problem: Safety: Goal: Non-violent Restraint(s) Outcome: Adequate for Discharge

## 2023-03-25 NOTE — TOC Transition Note (Addendum)
Transition of Care [facility]) - CM/SW Discharge Note   Patient Details  Name: Donald Robbins MRN: [id number] Date of Birth: 1940/09/25  [facility] CM/SW Contact:  Beckie Busing, RN Phone Number:[phone]  03/25/2023, 3:24 PM   Clinical Narrative:    Disposition plan has now changed to patient going home with home health referral. CM at bedside to offer choice. Patient/spouse have no specific agency. [facility]. Referral called to [facility]- pending.Referral sent to [facility]- pending. Referral sent to [facility]- can not accept. Referral sent to [facility]- pending. Referral sent to [facility]- pending.   1556 Home health referral has been accepted by [facility] home health. Family has been updated at the bedside       Barriers to Discharge: Continued Medical Work up   Patient Goals and CMS Choice CMS Medicare.gov Compare Post Acute Care list provided to:: Patient Choice offered to / list presented to : Patient, Spouse  Discharge Placement                         Discharge Plan and Services Additional resources added to the After Visit Summary for   In-house Referral: NA Discharge Planning Services: CM Consult Post Acute Care Choice: Skilled Nursing Facility          DME Arranged: N/A DME Agency: NA       HH Arranged: NA HH Agency: NA        Social Determinants of Health (SDOH) Interventions SDOH Screenings   Food Insecurity: No Food Insecurity (03/23/2023)  Housing: Low Risk  (03/23/2023)  Transportation Needs: No Transportation Needs (03/23/2023)  Utilities: Not At Risk (03/23/2023)  Social Connections: Unknown (10/21/2021)   Received from [facility]  Tobacco Use: Medium Risk (03/22/2023)     Readmission Risk Interventions     No data to display

## 2023-03-26 ENCOUNTER — Encounter: Payer: Self-pay | Admitting: Neurology

## 2023-03-29 ENCOUNTER — Encounter: Payer: Self-pay | Admitting: Neurology

## 2023-04-15 ENCOUNTER — Ambulatory Visit (INDEPENDENT_AMBULATORY_CARE_PROVIDER_SITE_OTHER): Payer: Medicare Other | Admitting: Neurology

## 2023-04-15 ENCOUNTER — Encounter: Payer: Self-pay | Admitting: Neurology

## 2023-04-15 VITALS — BP 120/69 | HR 65 | Ht 75.0 in | Wt 159.4 lb

## 2023-04-15 DIAGNOSIS — G40909 Epilepsy, unspecified, not intractable, without status epilepticus: Secondary | ICD-10-CM | POA: Diagnosis not present

## 2023-04-15 DIAGNOSIS — R413 Other amnesia: Secondary | ICD-10-CM | POA: Diagnosis not present

## 2023-04-15 MED ORDER — DONEPEZIL HCL 10 MG PO TABS: ORAL_TABLET | ORAL | 5 refills | Status: DC
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
## 2023-04-15 NOTE — Patient Instructions (Addendum)
Good to meet you.  Schedule 1-hour EEG  2. Schedule Neurocognitive testing  3. Continue Levetiracetam 500mg  twice a day. Let me know when you need refills  4. Start Donepezil 10mg : take 1/2 tablet daily for 2 weeks, then increase to 1 tablet daily  5. Follow-up in 4 months, call for any changes   You have been referred for a neurocognitive evaluation in our office.   The evaluation has two parts.   The first part of the evaluation is a clinical interview with the neuropsychologist (Dr. Milbert Coulter or Dr. Roseanne Reno). Please bring someone with you to this appointment if possible, as it is helpful for the doctor to hear from both you and another adult who knows you well.   The second part of the evaluation is testing with the doctor's technician Annabelle Harman or Selena Batten). The testing includes a variety of tasks- mostly question-and-answer, some paper-and-pencil. There is nothing you need to do to prepare for this appointment, but having a good night's sleep prior to the testing, taking medications as you normally would, and bringing eyeglasses and hearing aids (if you wear them), is advised. Please make sure that you wear a mask to the appointment.  Please note: We have to reserve several hours of the neuropsychologist's time and the psychometrician's time for your evaluation appointment. As such, please note that there is a No-Show fee of $100. If you are unable to attend any of your appointments, please contact our office as soon as possible to reschedule.    Seizure Precautions: 1. If medication has been prescribed for you to prevent seizures, take it exactly as directed.  Do not stop taking the medicine without talking to your doctor first, even if you have not had a seizure in a long time.   2. Avoid activities in which a seizure would cause danger to yourself or to others.  Don't operate dangerous machinery, swim alone, or climb in high or dangerous places, such as on ladders, roofs, or girders.  Do not  drive unless your doctor says you may.  3. If you have any warning that you may have a seizure, lay down in a safe place where you can't hurt yourself.    4.  No driving for 6 months from last seizure, as per [employer].   Please refer to the following link on the [employer]'s website for more information: http://www.epilepsyfoundation.org/answerplace/Social/driving/drivingu.cfm   5.  Maintain good sleep hygiene. Avoid alcohol.   6.  Contact your doctor if you have any problems that may be related to the medicine you are taking.  7.  Call 911 and bring the patient back to the ED if:        A.  The seizure lasts longer than 5 minutes.       B.  The patient doesn't awaken shortly after the seizure  C.  The patient has new problems such as difficulty seeing, speaking or moving  D.  The patient was injured during the seizure  E.  The patient has a temperature over 102 F (39C)  F.  The patient vomited and now is having trouble breathing

## 2023-04-20 ENCOUNTER — Ambulatory Visit: Payer: Medicare Other | Admitting: Neurology

## 2023-04-23 ENCOUNTER — Ambulatory Visit: Payer: Medicare Other | Admitting: Neurology

## 2023-04-23 DIAGNOSIS — R413 Other amnesia: Secondary | ICD-10-CM | POA: Diagnosis not present

## 2023-04-23 DIAGNOSIS — G40909 Epilepsy, unspecified, not intractable, without status epilepticus: Secondary | ICD-10-CM

## 2023-04-23 NOTE — Progress Notes (Signed)
EEG complete - results pending 

## 2023-04-29 NOTE — Procedures (Signed)
ELECTROENCEPHALOGRAM REPORT  Date of Study: 04/23/2023  Patient's Name: Donovyn Altermatt MRN: [id number] Date of Birth: 01/07/1941  Referring Provider: Dr. Patrcia Dolly  Clinical History: This is an 82 year old man with a history of seizures, memory loss. EEG for classification.  Seizure Medications: Keppra  Technical Summary: A multichannel digital 1-hour EEG recording measured by the international 10-20 system with electrodes applied with paste and impedances below 5000 ohms performed in our laboratory with EKG monitoring in an awake and asleep patient.  Hyperventilation was not performed. Photic stimulation was performed.  The digital EEG was referentially recorded, reformatted, and digitally filtered in a variety of bipolar and referential montages for optimal display.    Description: The patient is awake and asleep during the recording.  During maximal wakefulness, there is a symmetric, medium voltage 8-9 Hz posterior dominant rhythm that attenuates with eye opening.  The record is symmetric.  During drowsiness and sleep, there is an increase in theta slowing of the background.  Vertex waves and symmetric sleep spindles were seen. Photic stimulation did not elicit any abnormalities.  There were no epileptiform discharges or electrographic seizures seen.    EKG lead was unremarkable.  Impression: This 1-hour awake and asleep EEG is normal.    Clinical Correlation: A normal EEG does not exclude a clinical diagnosis of epilepsy.  If further clinical questions remain, prolonged EEG may be helpful.  Clinical correlation is advised.   Patrcia Dolly, M.D.

## 2023-05-21 ENCOUNTER — Ambulatory Visit: Payer: Medicare Other | Admitting: Neurology

## 2023-06-12 ENCOUNTER — Telehealth: Payer: Self-pay | Admitting: Neurology

## 2023-06-12 MED ORDER — LEVETIRACETAM 500 MG PO TABS: 500.0000 mg | ORAL_TABLET | Freq: Two times a day (BID) | ORAL | 3 refills | Status: DC

## 2023-06-12 NOTE — Telephone Encounter (Signed)
 Pt's wife called in stating the pt was ready to have the Keppra sent to [facility] [address] [facility]. Dr. Karel Jarvis is supposed to start prescribing it to him.

## 2023-06-12 NOTE — Telephone Encounter (Signed)
 Pls let her know I sent in a 90-day Rx with refills, thanks

## 2023-06-12 NOTE — Telephone Encounter (Signed)
 Pt called no answer left a voice mail per DPR that refills have been called in

## 2023-07-22 ENCOUNTER — Ambulatory Visit: Payer: Medicare Other | Admitting: Psychology

## 2023-07-22 ENCOUNTER — Ambulatory Visit: Payer: Medicare Other

## 2023-07-22 ENCOUNTER — Encounter: Payer: Self-pay | Admitting: Psychology

## 2023-07-22 DIAGNOSIS — R413 Other amnesia: Secondary | ICD-10-CM

## 2023-07-22 DIAGNOSIS — R41842 Visuospatial deficit: Secondary | ICD-10-CM

## 2023-07-22 DIAGNOSIS — F03A0 Unspecified dementia, mild, without behavioral disturbance, psychotic disturbance, mood disturbance, and anxiety: Secondary | ICD-10-CM | POA: Diagnosis not present

## 2023-07-22 DIAGNOSIS — R4189 Other symptoms and signs involving cognitive functions and awareness: Secondary | ICD-10-CM

## 2023-07-22 NOTE — Progress Notes (Signed)
   Psychometrician Note   Cognitive testing was administered to [name] by Shan Levans, B.S. (psychometrist) under the supervision of Dr. Annice Pih, Psy.D., licensed psychologist on 07/22/2023. Donald Robbins did not appear overtly distressed by the testing session per behavioral observation or responses across self-report questionnaires. Rest breaks were offered.    The battery of tests administered was selected by Dr. Annice Pih, Psy.D. with consideration to Donald Robbins current level of functioning, the nature of his symptoms, emotional and behavioral responses during interview, level of literacy, observed level of motivation/effort, and the nature of the referral question. This battery was communicated to the psychometrist. Communication between Dr. Annice Pih, Psy.D. and the psychometrist was ongoing throughout the evaluation and Dr. Annice Pih, Psy.D. was immediately accessible at all times. Dr. Annice Pih, Psy.D. provided supervision to the psychometrist on the date of this service to the extent necessary to assure the quality of all services provided.    Donald Robbins will return within approximately 1-2 weeks for an interactive feedback session with Dr. Robbie Lis at which time his test performances, clinical impressions, and treatment recommendations will be reviewed in detail. Donald Robbins understands he can contact our office should he require our assistance before this time.  A total of 155 minutes of billable time were spent face-to-face with Donald Robbins by the psychometrist. This includes both test administration and scoring time. Billing for these services is reflected in the clinical report generated by Dr. Annice Pih, Psy.D.  This note reflects time spent with the psychometrician and does not include test scores or any clinical interpretations made by Dr. Robbie Lis. The full report will follow in a separate note.
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
## 2023-07-28 ENCOUNTER — Ambulatory Visit: Payer: Medicare Other | Admitting: Psychology

## 2023-07-28 DIAGNOSIS — R41842 Visuospatial deficit: Secondary | ICD-10-CM

## 2023-07-28 DIAGNOSIS — F03A0 Unspecified dementia, mild, without behavioral disturbance, psychotic disturbance, mood disturbance, and anxiety: Secondary | ICD-10-CM

## 2023-07-28 DIAGNOSIS — R413 Other amnesia: Secondary | ICD-10-CM

## 2023-07-28 NOTE — Progress Notes (Signed)
   NEUROPSYCHOLOGY FEEDBACK SESSION [place]. [facility]  [place] Department of Neurology  Date of Feedback Session: 07/28/2023  REASON FOR REFERRAL   Donald Robbins is an 83 year old, right-handed, White male with 14 years of formal education. He was referred for neuropsychological evaluation by his neurologist, Patrcia Dolly, M.D., to assess current neurocognitive functioning, document potential cognitive deficits, and assist with treatment planning.  FEEDBACK   Patient completed a comprehensive neuropsychological evaluation on 07/22/2023. Please refer to that encounter for the full report and recommendations. Briefly, results suggested deficits in memory and visuospatial abilities in the context of reduced functional independence, warranting a diagnosis of mild dementia. The constellation of symptoms was highly concerning for an underlying neurodegenerative disease process, such as Alzheimer's disease. Additional neurological workup (e.g., FDG PET scan, CSF analysis, amyloid imaging) is needed to clarify the underlying etiology.   Today, the patient was accompanied by his wife, Darel Hong, and his daughter, Synetta Fail. They were provided verbal feedback regarding the findings and impression during this visit, and their questions were answered.  DISPOSITION   The patient will follow up with the referring provider, Dr. Karel Jarvis. He should return for repeat neuropsychological testing in one year (or sooner if further functional decline is observed) to monitor his course and assist with diagnosis and treatment planning.  SERVICE   This feedback session was conducted by Annice Pih, Psy.D. [place] unit of 13086 was billed for Dr. Robbie Lis' time spent in preparing, conducting, and documenting the current feedback session.  This report was generated using voice recognition software. While this document has been carefully reviewed, transcription errors may be present. I apologize in advance for any  inconvenience. Please contact me if further clarification is needed.

## 2023-09-30 ENCOUNTER — Other Ambulatory Visit: Payer: Self-pay | Admitting: Neurology

## 2023-10-05 ENCOUNTER — Encounter: Payer: Self-pay | Admitting: Neurology

## 2023-10-05 ENCOUNTER — Ambulatory Visit: Payer: Medicare Other | Admitting: Neurology

## 2023-10-05 VITALS — BP 133/57 | HR 42 | Ht 76.0 in | Wt 159.8 lb

## 2023-10-05 DIAGNOSIS — F03A0 Unspecified dementia, mild, without behavioral disturbance, psychotic disturbance, mood disturbance, and anxiety: Secondary | ICD-10-CM

## 2023-10-05 DIAGNOSIS — G40909 Epilepsy, unspecified, not intractable, without status epilepticus: Secondary | ICD-10-CM

## 2023-10-05 MED ORDER — LEVETIRACETAM 500 MG PO TABS: 500.0000 mg | ORAL_TABLET | Freq: Two times a day (BID) | ORAL | 3 refills | Status: DC

## 2023-10-05 MED ORDER — DONEPEZIL HCL 10 MG PO TABS: ORAL_TABLET | ORAL | 3 refills | Status: DC
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
## 2023-10-05 NOTE — Patient Instructions (Signed)
 Good to see you. Continue Levetiracetam  500mg  twice a day and Donepezil  10mg  daily. Continue gradually increasing exercise. Follow-up in 6 months, call for any changes.   Seizure Precautions: 1. If medication has been prescribed for you to prevent seizures, take it exactly as directed.  Do not stop taking the medicine without talking to your doctor first, even if you have not had a seizure in a long time.   2. Avoid activities in which a seizure would cause danger to yourself or to others.  Don't operate dangerous machinery, swim alone, or climb in high or dangerous places, such as on ladders, roofs, or girders.  Do not drive unless your doctor says you may.  3. If you have any warning that you may have a seizure, lay down in a safe place where you can't hurt yourself.    4.  No driving for 6 months from last seizure, as per [place]  state law.   Please refer to the following link on the Epilepsy Foundation of America's website for more information: http://www.epilepsyfoundation.org/answerplace/Social/driving/drivingu.cfm   5.  Maintain good sleep hygiene. Avoid alcohol.  6.  Contact your doctor if you have any problems that may be related to the medicine you are taking.  7.  Call 911 and bring the patient back to the ED if:        A.  The seizure lasts longer than 5 minutes.       B.  The patient doesn't awaken shortly after the seizure  C.  The patient has new problems such as difficulty seeing, speaking or moving  D.  The patient was injured during the seizure  E.  The patient has a temperature over 102 F (39C)  F.  The patient vomited and now is having trouble breathing         FALL PRECAUTIONS: Be cautious when walking. Scan the area for obstacles that may increase the risk of trips and falls. When getting up in the mornings, sit up at the edge of the bed for a few minutes before getting out of bed. Consider elevating the bed at the head end to avoid drop of blood pressure when  getting up. Walk always in a well-lit room (use night lights in the walls). Avoid area rugs or power cords from appliances in the middle of the walkways. Use a walker or a cane if necessary and consider physical therapy for balance exercise. Get your eyesight checked regularly.  FINANCIAL OVERSIGHT: Supervision, especially oversight when making financial decisions or transactions is also recommended.  HOME SAFETY: Consider the safety of the kitchen when operating appliances like stoves, microwave oven, and blender. Consider having supervision and share cooking responsibilities until no longer able to participate in those. Accidents with firearms and other hazards in the house should be identified and addressed as well.  DRIVING: Regarding driving, in patients with progressive memory problems, driving will be impaired. We advise to have someone else do the driving if trouble finding directions or if minor accidents are reported. Independent driving assessment is available to determine safety of driving.  ABILITY TO BE LEFT ALONE: If patient is unable to contact 911 operator, consider using LifeLine, or when the need is there, arrange for someone to stay with patients. Smoking is a fire hazard, consider supervision or cessation. Risk of wandering should be assessed by caregiver and if detected at any point, supervision and safe proof recommendations should be instituted.  MEDICATION SUPERVISION: Inability to self-administer medication needs to be  constantly addressed. Implement a mechanism to ensure safe administration of the medications.  RECOMMENDATIONS FOR ALL PATIENTS WITH MEMORY PROBLEMS: 1. Continue to exercise (Recommend 30 minutes of walking everyday, or 3 hours every week) 2. Increase social interactions - continue going to [place] and enjoy social gatherings with friends and family 3. Eat healthy, avoid fried foods and eat more fruits and vegetables 4. Maintain adequate blood pressure, blood  sugar, and blood cholesterol level. Reducing the risk of stroke and cardiovascular disease also helps promoting better memory. 5. Avoid stressful situations. Live a simple life and avoid aggravations. Organize your time and prepare for the next day in anticipation. 6. Sleep well, avoid any interruptions of sleep and avoid any distractions in the bedroom that may interfere with adequate sleep quality 7. Avoid sugar, avoid sweets as there is a strong link between excessive sugar intake, diabetes, and cognitive impairment The Mediterranean diet has been shown to help patients reduce the risk of progressive memory disorders and reduces cardiovascular risk. This includes eating fish, eat fruits and green leafy vegetables, nuts like almonds and hazelnuts, walnuts, and also use olive oil. Avoid fast foods and fried foods as much as possible. Avoid sweets and sugar as sugar use has been linked to worsening of memory function.  There is always a concern of gradual progression of memory problems. If this is the case, then we may need to adjust level of care according to patient needs. Support, both to the patient and caregiver, should then be put into place.

## 2024-03-14 ENCOUNTER — Institutional Professional Consult (permissible substitution): Payer: Medicare Other | Admitting: Psychology

## 2024-03-14 ENCOUNTER — Ambulatory Visit: Payer: Self-pay

## 2024-03-21 ENCOUNTER — Encounter: Payer: Medicare Other | Admitting: Psychology

## 2024-04-28 ENCOUNTER — Ambulatory Visit: Admitting: Neurology

## 2024-04-28 ENCOUNTER — Encounter: Payer: Self-pay | Admitting: Neurology

## 2024-04-28 VITALS — BP 103/71 | HR 73 | Ht 76.0 in | Wt 157.8 lb

## 2024-04-28 DIAGNOSIS — F03A0 Unspecified dementia, mild, without behavioral disturbance, psychotic disturbance, mood disturbance, and anxiety: Secondary | ICD-10-CM

## 2024-04-28 DIAGNOSIS — G40909 Epilepsy, unspecified, not intractable, without status epilepticus: Secondary | ICD-10-CM

## 2024-04-28 MED ORDER — DONEPEZIL HCL 10 MG PO TABS: ORAL_TABLET | ORAL | 3 refills | Status: AC

## 2024-04-28 MED ORDER — LEVETIRACETAM 500 MG PO TABS: 500.0000 mg | ORAL_TABLET | Freq: Two times a day (BID) | ORAL | 3 refills | Status: AC
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
## 2024-04-28 NOTE — Patient Instructions (Addendum)
 Good to see you doing well! Continue all your medications. Follow-up in 8 months, call for any changes.   Seizure Precautions: 1. If medication has been prescribed for you to prevent seizures, take it exactly as directed.  Do not stop taking the medicine without talking to your doctor first, even if you have not had a seizure in a long time.   2. Avoid activities in which a seizure would cause danger to yourself or to others.  Don't operate dangerous machinery, swim alone, or climb in high or dangerous places, such as on ladders, roofs, or girders.  Do not drive unless your doctor says you may.  3. If you have any warning that you may have a seizure, lay down in a safe place where you can't hurt yourself.    4.  No driving for 6 months from last seizure, as per   state law.   Please refer to the following link on the Epilepsy Foundation of [employer]'s website for more information: http://www.epilepsyfoundation.org/answerplace/Social/driving/drivingu.cfm   5.  Maintain good sleep hygiene. Avoid alcohol.  6.  Contact your doctor if you have any problems that may be related to the medicine you are taking.  7.  Call 911 and bring the patient back to the ED if:        A.  The seizure lasts longer than 5 minutes.       B.  The patient doesn't awaken shortly after the seizure  C.  The patient has new problems such as difficulty seeing, speaking or moving  D.  The patient was injured during the seizure  E.  The patient has a temperature over 102 F (39C)  F.  The patient vomited and now is having trouble breathing         FALL PRECAUTIONS: Be cautious when walking. Scan the area for obstacles that may increase the risk of trips and falls. When getting up in the mornings, sit up at the edge of the bed for a few minutes before getting out of bed. Consider elevating the bed at the head end to avoid drop of blood pressure when getting up. Walk always in a well-lit room (use night lights in  the walls). Avoid area rugs or power cords from appliances in the middle of the walkways. Use a walker or a cane if necessary and consider physical therapy for balance exercise. Get your eyesight checked regularly.  FINANCIAL OVERSIGHT: Supervision, especially oversight when making financial decisions or transactions is also recommended.  HOME SAFETY: Consider the safety of the kitchen when operating appliances like stoves, microwave oven, and blender. Consider having supervision and share cooking responsibilities until no longer able to participate in those. Accidents with firearms and other hazards in the house should be identified and addressed as well.  DRIVING: Regarding driving, in patients with progressive memory problems, driving will be impaired. We advise to have someone else do the driving if trouble finding directions or if minor accidents are reported. Independent driving assessment is available to determine safety of driving.  ABILITY TO BE LEFT ALONE: If patient is unable to contact 911 operator, consider using LifeLine, or when the need is there, arrange for someone to stay with patients. Smoking is a fire hazard, consider supervision or cessation. Risk of wandering should be assessed by caregiver and if detected at any point, supervision and safe proof recommendations should be instituted.  MEDICATION SUPERVISION: Inability to self-administer medication needs to be constantly addressed. Implement a mechanism to ensure safe  administration of the medications.  RECOMMENDATIONS FOR ALL PATIENTS WITH MEMORY PROBLEMS: 1. Continue to exercise (Recommend 30 minutes of walking everyday, or 3 hours every week) 2. Increase social interactions - continue going to [place] and enjoy social gatherings with friends and family 3. Eat healthy, avoid fried foods and eat more fruits and vegetables 4. Maintain adequate blood pressure, blood sugar, and blood cholesterol level. Reducing the risk of stroke  and cardiovascular disease also helps promoting better memory. 5. Avoid stressful situations. Live a simple life and avoid aggravations. Organize your time and prepare for the next day in anticipation. 6. Sleep well, avoid any interruptions of sleep and avoid any distractions in the bedroom that may interfere with adequate sleep quality 7. Avoid sugar, avoid sweets as there is a strong link between excessive sugar intake, diabetes, and cognitive impairment The Mediterranean diet has been shown to help patients reduce the risk of progressive memory disorders and reduces cardiovascular risk. This includes eating fish, eat fruits and green leafy vegetables, nuts like almonds and hazelnuts, walnuts, and also use olive oil. Avoid fast foods and fried foods as much as possible. Avoid sweets and sugar as sugar use has been linked to worsening of memory function.  There is always a concern of gradual progression of memory problems. If this is the case, then we may need to adjust level of care according to patient needs. Support, both to the patient and caregiver, should then be put into place.

## 2024-12-26 ENCOUNTER — Ambulatory Visit: Admitting: Neurology
